# Patient Record
Sex: Female | Born: 1985 | Race: White | Hispanic: No | Marital: Married | Smoking: Never smoker
Health system: Southern US, Community
[De-identification: ages and names within clinical notes are randomized; demographics above are authoritative.]

## PROBLEM LIST (undated history)

## (undated) DIAGNOSIS — K589 Irritable bowel syndrome without diarrhea: Secondary | ICD-10-CM

## (undated) DIAGNOSIS — J45909 Unspecified asthma, uncomplicated: Secondary | ICD-10-CM

## (undated) DIAGNOSIS — O24419 Gestational diabetes mellitus in pregnancy, unspecified control: Secondary | ICD-10-CM

## (undated) DIAGNOSIS — F419 Anxiety disorder, unspecified: Secondary | ICD-10-CM

## (undated) DIAGNOSIS — R87629 Unspecified abnormal cytological findings in specimens from vagina: Secondary | ICD-10-CM

## (undated) HISTORY — DX: Unspecified asthma, uncomplicated: J45.909

## (undated) HISTORY — DX: Unspecified abnormal cytological findings in specimens from vagina: R87.629

## (undated) HISTORY — DX: Anxiety disorder, unspecified: F41.9

## (undated) HISTORY — DX: Irritable bowel syndrome, unspecified: K58.9

## (undated) HISTORY — PX: LEEP: SHX91

---

## 2014-08-03 ENCOUNTER — Other Ambulatory Visit (HOSPITAL_COMMUNITY)
Admission: RE | Admit: 2014-08-03 | Discharge: 2014-08-03 | Disposition: A | Discharge status: Home / Self Care | Payer: Managed Care, Other (non HMO) | Source: Ambulatory Visit | Attending: Obstetrics & Gynecology | Admitting: Obstetrics & Gynecology

## 2014-08-03 ENCOUNTER — Encounter: Payer: Self-pay | Admitting: Obstetrics & Gynecology

## 2014-08-03 ENCOUNTER — Ambulatory Visit (INDEPENDENT_AMBULATORY_CARE_PROVIDER_SITE_OTHER): Payer: Managed Care, Other (non HMO) | Admitting: Obstetrics & Gynecology

## 2014-08-03 VITALS — BP 110/80 | HR 68 | Ht 61.0 in | Wt 112.0 lb

## 2014-08-03 DIAGNOSIS — N939 Abnormal uterine and vaginal bleeding, unspecified: Secondary | ICD-10-CM | POA: Diagnosis not present

## 2014-08-03 DIAGNOSIS — Z113 Encounter for screening for infections with a predominantly sexual mode of transmission: Secondary | ICD-10-CM | POA: Insufficient documentation

## 2014-08-03 DIAGNOSIS — N923 Ovulation bleeding: Secondary | ICD-10-CM

## 2014-08-03 DIAGNOSIS — Z01419 Encounter for gynecological examination (general) (routine) without abnormal findings: Secondary | ICD-10-CM | POA: Insufficient documentation

## 2014-08-03 DIAGNOSIS — Z87410 Personal history of cervical dysplasia: Secondary | ICD-10-CM | POA: Diagnosis not present

## 2014-08-03 NOTE — Addendum Note (Signed)
Addended by: Doyne Keel on: 08/03/2014 03:45 PM   Modules accepted: Orders

## 2014-08-03 NOTE — Progress Notes (Signed)
Patient ID: Betty Acosta, female   DOB: Feb 16, 1986, 29 y.o.   MRN: [id number]   Chief Complaint  Patient presents with  . gyn visit    vaginal spotting befoe period.     HPI:    29 y.o. No obstetric history on file. Patient's last menstrual period was 07/20/2014.  Pt had LEEP 5 years ago, no Pap in 2 years or so Spots between cycles ovulation Has sex 4-5 times per month Occasional positional dyspareunia Has IBS     No current outpatient prescriptions on file.  Problem Pertinent ROS:       No burning with urination, frequency or urgency No nausea, vomiting or diarrhea Nor fever chills or other constitutional symptoms   Extended ROS:         PMFSH:             Past Medical History  Diagnosis Date  . Asthma     Past Surgical History  Procedure Laterality Date  . Leep      OB History    No data available      Not on File  History   Social History  . Marital Status: Married    Spouse Name: N/A  . Number of Children: N/A  . Years of Education: N/A   Social History Main Topics  . Smoking status: Never Smoker   . Smokeless tobacco: Not on file  . Alcohol Use: Not on file  . Drug Use: Not on file  . Sexual Activity: Yes   Other Topics Concern  . Not on file   Social History Narrative  . No narrative on file    Family History  Problem Relation Age of Onset  . Diabetes Maternal Aunt   . Hypertension Maternal Aunt      Examination:  Vitals:  Blood pressure 110/80, pulse 68, height 5\' 1"  (1.549 m), weight 112 lb (50.803 kg), last menstrual period 07/20/2014.    Physical Examination:     General Appearance:  awake, alert, oriented, in no acute distress Bilateral small inguinal lymph nodes  Vulva:  normal appearing vulva with no masses, tenderness or lesions Vagina:  normal mucosa, scant blood Cervix:  no bleeding following Pap, no cervical motion tenderness, no lesions and nulliparous appearance Uterus:  normal size, contour, position,  consistency, mobility, non-tender Adnexa: ovaries:present,  normal adnexa in size, nontender and no masses     DATA orders and reviews: Labs were ordered today:  GC Chlamydia Pap Imaging studies were not ordered today:    Lab tests were not reviewed today:    Imaging studies were not reviewed today:    I did not independently review/view images, tracing or specimen(not simply the report) myself.  Prescription Drug Management:  New Prescriptions:  Renewed Prescriptions:   Current prescription changes:     Impression/Plan(Problem Based): 1.  History of cervical dysplasia      (follow up of a pre-existing problem) : Additional workup is needed:  Pap done  {2.  Intermenstrual spotting      (new problem:show no change) : Additional workup is needed:  GC Chlamydia done     Follow Up:   6  months

## 2014-08-07 LAB — CYTOLOGY - PAP

## 2015-01-08 ENCOUNTER — Ambulatory Visit: Payer: Managed Care, Other (non HMO) | Admitting: Obstetrics & Gynecology

## 2015-01-09 ENCOUNTER — Encounter: Payer: Self-pay | Admitting: Obstetrics & Gynecology

## 2015-01-09 ENCOUNTER — Ambulatory Visit (INDEPENDENT_AMBULATORY_CARE_PROVIDER_SITE_OTHER): Payer: Managed Care, Other (non HMO) | Admitting: Obstetrics & Gynecology

## 2015-01-09 VITALS — BP 128/80 | HR 58 | Ht 61.25 in | Wt 111.0 lb

## 2015-01-09 DIAGNOSIS — Z3169 Encounter for other general counseling and advice on procreation: Secondary | ICD-10-CM | POA: Diagnosis not present

## 2015-01-09 NOTE — Progress Notes (Signed)
Patient ID: Betty Acosta, female   DOB: 02/12/1986, 29 y.o.   MRN: [id number] Chief Complaint  Patient presents with  . Follow-up    trying to get pregnant    Blood pressure 128/80, pulse 58, height 5' 1.25" (1.556 m), weight 111 lb (50.349 kg), last menstrual period 01/04/2015.  29 y.o.. Patient's last menstrual period was 01/04/2015. The current method of family planning is none.  Subjective This is my second visit with Betty Acosta regarding trying to get pregnant She has relatively regular periods At my request she's been doing ovulation predictor kits which have been positive She is been timing her intercourse appropriately She's never had a pregnancy Her husband's never fathered a pregnancy We discussed at length the issues of female and female infertility issues My biggest concern is this could represent a tubal blockage She denies ever having had a pelvic infection specifically chlamydia or gonorrhea    Objective   Pertinent ROS   Labs or studies     Impression Diagnoses this Encounter::   ICD-9-CM ICD-10-CM   1. Infertility counseling V26.49 Z31.69     Established relevant diagnosis(es): Nulliparous  Plan/Recommendations: No orders of the defined types were placed in this encounter.    Labs or Scans Ordered: No orders of the defined types were placed in this encounter.    Betty Acosta is going to call with her next period and we'll then do a hysterosalpingogram with her next pregnancy day 7-11  Follow up Return if symptoms worsen or fail to improve.      Face to face time:  15 minutes  Greater than 50% of the visit time was spent in counseling and coordination of care with the patient.  The summary and outline of the counseling and care coordination is summarized in the note above.   All questions were answered.

## 2015-01-12 ENCOUNTER — Ambulatory Visit: Payer: Managed Care, Other (non HMO) | Admitting: Obstetrics & Gynecology

## 2015-01-31 ENCOUNTER — Other Ambulatory Visit: Payer: Self-pay | Admitting: Obstetrics & Gynecology

## 2015-01-31 ENCOUNTER — Telehealth: Payer: Self-pay | Admitting: Physician Assistant

## 2015-01-31 DIAGNOSIS — N971 Female infertility of tubal origin: Secondary | ICD-10-CM

## 2015-01-31 NOTE — Telephone Encounter (Signed)
I have placed an order in [facility] for an HSG to be done next Wednesday am, 02/07/2015  Please have pt call radiology [phone] to confirm the scheduling  thanks

## 2015-02-01 NOTE — Telephone Encounter (Signed)
Pt informed HSG at [facility] on 02/07/2015 at 0815.

## 2015-02-07 ENCOUNTER — Ambulatory Visit (HOSPITAL_COMMUNITY)
Admission: RE | Admit: 2015-02-07 | Discharge: 2015-02-07 | Disposition: A | Discharge status: Home / Self Care | Payer: Managed Care, Other (non HMO) | Source: Ambulatory Visit | Attending: Obstetrics & Gynecology | Admitting: Obstetrics & Gynecology

## 2015-02-07 DIAGNOSIS — N971 Female infertility of tubal origin: Secondary | ICD-10-CM | POA: Diagnosis present

## 2015-02-07 MED ORDER — IOHEXOL 350 MG/ML SOLN
50.0000 mL | Freq: Once | INTRAVENOUS | Status: DC | PRN
  Administered 2015-02-07: 5 mL via INTRAVENOUS
  Filled 2015-02-07: qty 50

## 2015-02-07 MED ORDER — POVIDONE-IODINE 10 % EX SOLN
CUTANEOUS | Status: AC
  Filled 2015-02-07: qty 15

## 2015-04-25 ENCOUNTER — Ambulatory Visit (INDEPENDENT_AMBULATORY_CARE_PROVIDER_SITE_OTHER): Payer: Managed Care, Other (non HMO) | Admitting: Obstetrics & Gynecology

## 2015-04-25 ENCOUNTER — Encounter: Payer: Self-pay | Admitting: Obstetrics & Gynecology

## 2015-04-25 VITALS — BP 100/60 | HR 62 | Wt 113.0 lb

## 2015-04-25 DIAGNOSIS — Z3169 Encounter for other general counseling and advice on procreation: Secondary | ICD-10-CM | POA: Diagnosis not present

## 2015-04-25 NOTE — Progress Notes (Signed)
Patient ID: Betty Acosta, female   DOB: [birth year], 30 y.o.   MRN: [id number] Patient ID: Betty Acosta, female   DOB: [birth year], 30 y.o.   MRN: [id number]  Normal HSG No other new information  Recommend proceeding with semen analysis If abnormal will have work up and evlaution If normal try 3-6 months of clomid  And if still not successful will have seen by [facility]     Face to face time:  10 minutes  Greater than 50% of the visit time was spent in counseling and coordination of care with the patient.  The summary and outline of the counseling and care coordination is summarized in the note above.   All questions were answered.  Chief Complaint  Patient presents with  . Follow-up    Blood pressure 100/60, pulse 62, weight 113 lb (51.256 kg), last menstrual period 04/24/2015.  29 y.o.. Patient's last menstrual period was 04/24/2015. The current method of family planning is none.  Subjective This is my second visit with Betty Acosta regarding trying to get pregnant She has relatively regular periods At my request she's been doing ovulation predictor kits which have been positive She is been timing her intercourse appropriately She's never had a pregnancy Her husband's never fathered a pregnancy We discussed at length the issues of female and female infertility issues My biggest concern is this could represent a tubal blockage She denies ever having had a pelvic infection specifically chlamydia or gonorrhea    Objective   Pertinent ROS   Labs or studies     Impression Diagnoses this Encounter::   ICD-9-CM ICD-10-CM   1. Infertility counseling V26.49 Z31.69     Established relevant diagnosis(es): Nulliparous  Plan/Recommendations: No orders of the defined types were placed in this encounter.    Labs or Scans Ordered: No orders of the defined types were placed in this encounter.    Betty Acosta is going to call with her next period and we'll then do a hysterosalpingogram with  her next pregnancy day 7-11  Follow up Return if symptoms worsen or fail to improve.      Face to face time:  15 minutes  Greater than 50% of the visit time was spent in counseling and coordination of care with the patient.  The summary and outline of the counseling and care coordination is summarized in the note above.   All questions were answered.

## 2015-10-09 ENCOUNTER — Telehealth: Payer: Self-pay | Admitting: Obstetrics & Gynecology

## 2015-10-09 NOTE — Telephone Encounter (Addendum)
Pt informed as soon as our office receive fax for seaman analysis will let Dr. Elonda Husky review and contact pt with plan. Pt verbalized understanding.   Received fax of [facility] analysis and gave to Dr.Eure for is review.

## 2015-10-22 ENCOUNTER — Telehealth: Payer: Self-pay | Admitting: *Deleted

## 2015-10-22 NOTE — Telephone Encounter (Signed)
Pt informed Dr.Eure does have the [facility] analysis results, will route message to him for plan.

## 2015-10-23 NOTE — Telephone Encounter (Signed)
I called pt and left a message to contact Roscoe at [email or web address] to investigate next level fertility evaluation and management Informed the semen analysis was normal Pt told to call back here if she has any questions

## 2015-10-24 NOTE — Telephone Encounter (Signed)
Pt states she is interested in starting Clomid before being seen by a reproductive endocrinologist.   Per Dr.Eure, pt is have regular periods therefore ovulation is not an issue, so at this point he usually refers to Reproductive Endocrinologist. Dr.Eure states is willing to do a few months of Clomid but would like for the pt and her husband to think about it a few days and call our office back with decision.   Pt verbalized understanding.

## 2015-10-29 ENCOUNTER — Telehealth: Payer: Self-pay | Admitting: Obstetrics & Gynecology

## 2015-10-29 NOTE — Telephone Encounter (Signed)
Duplicate message. 

## 2015-10-30 ENCOUNTER — Other Ambulatory Visit: Payer: Self-pay | Admitting: Obstetrics & Gynecology

## 2015-10-30 MED ORDER — CLOMIPHENE CITRATE 50 MG PO TABS: ORAL_TABLET | ORAL | 11 refills | Status: DC

## 2016-02-01 ENCOUNTER — Ambulatory Visit (INDEPENDENT_AMBULATORY_CARE_PROVIDER_SITE_OTHER): Payer: Managed Care, Other (non HMO) | Admitting: Obstetrics & Gynecology

## 2016-02-01 ENCOUNTER — Encounter: Payer: Self-pay | Admitting: Obstetrics & Gynecology

## 2016-02-01 VITALS — BP 120/70 | HR 72 | Wt 118.0 lb

## 2016-02-01 DIAGNOSIS — N6310 Unspecified lump in the right breast, unspecified quadrant: Secondary | ICD-10-CM

## 2016-02-01 DIAGNOSIS — N6315 Unspecified lump in the right breast, overlapping quadrants: Secondary | ICD-10-CM

## 2016-02-01 NOTE — Progress Notes (Signed)
Chief Complaint  Patient presents with  . lump RT breast    Blood pressure 120/70, pulse 72, weight 118 lb (53.5 kg), last menstrual period 01/29/2016.  30 y.o. No obstetric history on file. Patient's last menstrual period was 01/29/2016. The current method of family planning is none. Trying to get pregnant  Outpatient Encounter Prescriptions as of 02/01/2016  Medication Sig  . clomiPHENE (CLOMID) 50 MG tablet Take 1 tablet daily for 5 days.  Start the tablets on the first day of your menstrual cycle   No facility-administered encounter medications on file as of 02/01/2016.     Subjective Pt states she has felt a right breast mass for about 6 months unchanged No erythema discharge from nipple or tenderness except when she presses  Objective Breast exam reveals no masses or tenderness no nipple changes The spot that Elaisha is concerned about is actually her rib, when I move the breast about on her chest wall the area remains the same  Pertinent ROS No burning with urination, frequency or urgency No nausea, vomiting or diarrhea Nor fever chills or other constitutional symptoms   Labs or studies     Impression Diagnoses this Encounter::   ICD-9-CM ICD-10-CM   1. Breast lump on right side at 6 o'clock position 611.72 N63.10    actually not a breast mass but her rib, pt examined as well with me and also agrees that iti is her rib she is felling    Established relevant diagnosis(es): Primary infertility  Plan/Recommendations: No orders of the defined types were placed in this encounter.   Labs or Scans Ordered: No orders of the defined types were placed in this encounter.   Management:: No imaging studies or follow up needed Has made an appointment to see [facility]  Follow up Return if symptoms worsen or fail to improve.        Face to face time:  15 minutes  Greater than 50% of the visit time was spent in counseling and coordination of care with the  patient.  The summary and outline of the counseling and care coordination is summarized in the note above.   All questions were answered.  Past Medical History:  Diagnosis Date  . Asthma   . IBS (irritable bowel syndrome)     Past Surgical History:  Procedure Laterality Date  . LEEP      OB History    No data available      No Known Allergies  Social History   Social History  . Marital status: Married    Spouse name: N/A  . Number of children: N/A  . Years of education: N/A   Social History Main Topics  . Smoking status: Never Smoker  . Smokeless tobacco: Never Used  . Alcohol use No  . Drug use: No  . Sexual activity: Yes    Birth control/ protection: None   Other Topics Concern  . None   Social History Narrative  . None    Family History  Problem Relation Age of Onset  . Diabetes Maternal Aunt   . Hypertension Maternal Aunt   . Diabetes Maternal Grandmother   . Congestive Heart Failure Mother   . Hypertension Mother   . Fibromyalgia Mother   . Other Mother     heart blockages  . Hypertension Sister   . Other Sister     broderline diabetes  . ADD / ADHD Brother   . Autism Brother   .  Cancer Paternal Grandmother   . ADD / ADHD Brother

## 2016-12-08 ENCOUNTER — Telehealth: Payer: Self-pay | Admitting: Obstetrics & Gynecology

## 2016-12-08 NOTE — Telephone Encounter (Signed)
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 Please contact pt

## 2016-12-08 NOTE — Telephone Encounter (Signed)
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 Advised patient at this time lifting is fine but could get a note if needed at her visit next week. Verbalized understanding.

## 2016-12-11 ENCOUNTER — Telehealth: Payer: Self-pay | Admitting: Women's Health

## 2016-12-11 ENCOUNTER — Ambulatory Visit (INDEPENDENT_AMBULATORY_CARE_PROVIDER_SITE_OTHER): Payer: 59 | Admitting: Adult Health

## 2016-12-11 ENCOUNTER — Encounter: Payer: Self-pay | Admitting: Adult Health

## 2016-12-11 VITALS — BP 90/50 | HR 80 | Ht 62.0 in | Wt 111.0 lb

## 2016-12-11 DIAGNOSIS — Z3201 Encounter for pregnancy test, result positive: Secondary | ICD-10-CM | POA: Diagnosis not present

## 2016-12-11 DIAGNOSIS — N926 Irregular menstruation, unspecified: Secondary | ICD-10-CM | POA: Diagnosis not present

## 2016-12-11 DIAGNOSIS — O26851 Spotting complicating pregnancy, first trimester: Secondary | ICD-10-CM

## 2016-12-11 DIAGNOSIS — O3680X0 Pregnancy with inconclusive fetal viability, not applicable or unspecified: Secondary | ICD-10-CM

## 2016-12-11 LAB — POCT WET PREP (WET MOUNT): WBC, Wet Prep HPF POC: POSITIVE

## 2016-12-11 LAB — POCT URINE PREGNANCY: Preg Test, Ur: POSITIVE — AB

## 2016-12-11 NOTE — Telephone Encounter (Signed)
Pt called stating that she had been spotting light pink and had already spoken to someone about that; but last night she started spotting bright red. She states that she is no longer spotting this morning but is concerned because her appt is not until Tuesday. Pt was offered an appt today at 1:30

## 2016-12-11 NOTE — Addendum Note (Signed)
Addended by: Derrek Monaco A on: 12/11/2016 02:26 PM   Modules accepted: Orders

## 2016-12-11 NOTE — Progress Notes (Signed)
Subjective:     Patient ID: Betty Acosta, female   DOB: 12-08-1985, 31 y.o.   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 She had taken clomid in the past but not lately, so when missed a period and had +HPT was excited.   Review of Systems  Had missed a period and had +HPT Spotting since 12/02/16 Reviewed past medical,surgical, social and family history. Reviewed medications and allergies.     Objective:   Physical Exam BP (!) 90/50 (BP Location: Left Arm, Patient Position: Sitting, Cuff Size: Small)   Pulse 80   Ht 5\' 2"  (1.575 m)   Wt 111 lb (50.3 kg)   LMP 11/06/2016   BMI 20.30 kg/m UPT +, about 5 weeks by LMP, with EDD 08/13/17.PHQ 2 score 0. Skin warm and dry.Pelvic: external genitalia is normal in appearance no lesions, vagina: pinkish tan discharge without odor,urethra has no lesions or masses noted, cervix:irregular at os, sp LEEP, uterus: about 6 week size, non tender, no masses felt, adnexa: no masses or tenderness noted. Bladder is non tender and no masses felt. Wet prep: + RBCs +WBCs. GC/CHL obtained.     Assessment:     1. Spotting affecting pregnancy in first trimester   2. Pregnancy test positive   3. Encounter to determine fetal viability of pregnancy, single or unspecified fetus       Plan:    GC/CHL sent on urine  Check QHCG and progesterone level Take Gummies Note given for no lifting >25lbs and no ladders over 2 steps Follow up in 2 weeks for dating [place] No sex for now Review handout on first trimester and by [facility]

## 2016-12-11 NOTE — Patient Instructions (Signed)
First Trimester of Pregnancy The first trimester of pregnancy is from week 1 until the end of week 13 (months 1 through 3). A week after a sperm fertilizes an egg, the egg will implant on the wall of the uterus. This embryo will begin to develop into a baby. Genes from you and your partner will form the baby. The female genes will determine whether the baby will be a boy or a girl. At 6-8 weeks, the eyes and face will be formed, and the heartbeat can be seen on ultrasound. At the end of 12 weeks, all the baby's organs will be formed. Now that you are pregnant, you will want to do everything you can to have a healthy baby. Two of the most important things are to get good prenatal care and to follow your health care provider's instructions. Prenatal care is all the medical care you receive before the baby's birth. This care will help prevent, find, and treat any problems during the pregnancy and childbirth. Body changes during your first trimester Your body goes through many changes during pregnancy. The changes vary from woman to woman.  You may gain or lose a couple of pounds at first.  You may feel sick to your stomach (nauseous) and you may throw up (vomit). If the vomiting is uncontrollable, call your health care provider.  You may tire easily.  You may develop headaches that can be relieved by medicines. All medicines should be approved by your health care provider.  You may urinate more often. Painful urination may mean you have a bladder infection.  You may develop heartburn as a result of your pregnancy.  You may develop constipation because certain hormones are causing the muscles that push stool through your intestines to slow down.  You may develop hemorrhoids or swollen veins (varicose veins).  Your breasts may begin to grow larger and become tender. Your nipples may stick out more, and the tissue that surrounds them (areola) may become darker.  Your gums may bleed and may be  sensitive to brushing and flossing.  Dark spots or blotches (chloasma, mask of pregnancy) may develop on your face. This will likely fade after the baby is born.  Your menstrual periods will stop.  You may have a loss of appetite.  You may develop cravings for certain kinds of food.  You may have changes in your emotions from day to day, such as being excited to be pregnant or being concerned that something may go wrong with the pregnancy and baby.  You may have more vivid and strange dreams.  You may have changes in your hair. These can include thickening of your hair, rapid growth, and changes in texture. Some women also have hair loss during or after pregnancy, or hair that feels dry or thin. Your hair will most likely return to normal after your baby is born.  What to expect at prenatal visits During a routine prenatal visit:  You will be weighed to make sure you and the baby are growing normally.  Your blood pressure will be taken.  Your abdomen will be measured to track your baby's growth.  The fetal heartbeat will be listened to between weeks 10 and 14 of your pregnancy.  Test results from any previous visits will be discussed.  Your health care provider may ask you:  How you are feeling.  If you are feeling the baby move.  If you have had any abnormal symptoms, such as leaking fluid, bleeding, severe headaches,   or abdominal cramping.  If you are using any tobacco products, including cigarettes, chewing tobacco, and electronic cigarettes.  If you have any questions.  Other tests that may be performed during your first trimester include:  Blood tests to find your blood type and to check for the presence of any previous infections. The tests will also be used to check for low iron levels (anemia) and protein on red blood cells (Rh antibodies). Depending on your risk factors, or if you previously had diabetes during pregnancy, you may have tests to check for high blood  sugar that affects pregnant women (gestational diabetes).  Urine tests to check for infections, diabetes, or protein in the urine.  An ultrasound to confirm the proper growth and development of the baby.  Fetal screens for spinal cord problems (spina bifida) and Down syndrome.  HIV (human immunodeficiency virus) testing. Routine prenatal testing includes screening for HIV, unless you choose not to have this test.  You may need other tests to make sure you and the baby are doing well.  Follow these instructions at home: Medicines  Follow your health care provider's instructions regarding medicine use. Specific medicines may be either safe or unsafe to take during pregnancy.  Take a prenatal vitamin that contains at least 600 micrograms (mcg) of folic acid.  If you develop constipation, try taking a stool softener if your health care provider approves. Eating and drinking  Eat a balanced diet that includes fresh fruits and vegetables, whole grains, good sources of protein such as meat, eggs, or tofu, and low-fat dairy. Your health care provider will help you determine the amount of weight gain that is right for you.  Avoid raw meat and uncooked cheese. These carry germs that can cause birth defects in the baby.  Eating four or five small meals rather than three large meals a day may help relieve nausea and vomiting. If you start to feel nauseous, eating a few soda crackers can be helpful. Drinking liquids between meals, instead of during meals, also seems to help ease nausea and vomiting.  Limit foods that are high in fat and processed sugars, such as fried and sweet foods.  To prevent constipation: ? Eat foods that are high in fiber, such as fresh fruits and vegetables, whole grains, and beans. ? Drink enough fluid to keep your urine clear or pale yellow. Activity  Exercise only as directed by your health care provider. Most women can continue their usual exercise routine during  pregnancy. Try to exercise for 30 minutes at least 5 days a week. Exercising will help you: ? Control your weight. ? Stay in shape. ? Be prepared for labor and delivery.  Experiencing pain or cramping in the lower abdomen or lower back is a good sign that you should stop exercising. Check with your health care provider before continuing with normal exercises.  Try to avoid standing for long periods of time. Move your legs often if you must stand in one place for a long time.  Avoid heavy lifting.  Wear low-heeled shoes and practice good posture.  You may continue to have sex unless your health care provider tells you not to. Relieving pain and discomfort  Wear a good support bra to relieve breast tenderness.  Take warm sitz baths to soothe any pain or discomfort caused by hemorrhoids. Use hemorrhoid cream if your health care provider approves.  Rest with your legs elevated if you have leg cramps or low back pain.  If you develop   varicose veins in your legs, wear support hose. Elevate your feet for 15 minutes, 3-4 times a day. Limit salt in your diet. Prenatal care  Schedule your prenatal visits by the twelfth week of pregnancy. They are usually scheduled monthly at first, then more often in the last 2 months before delivery.  Write down your questions. Take them to your prenatal visits.  Keep all your prenatal visits as told by your health care provider. This is important. Safety  Wear your seat belt at all times when driving.  Make a list of emergency phone numbers, including numbers for family, friends, the hospital, and police and fire departments. General instructions  Ask your health care provider for a referral to a local prenatal education class. Begin classes no later than the beginning of month 6 of your pregnancy.  Ask for help if you have counseling or nutritional needs during pregnancy. Your health care provider can offer advice or refer you to specialists for help  with various needs.  Do not use hot tubs, steam rooms, or saunas.  Do not douche or use tampons or scented sanitary pads.  Do not cross your legs for long periods of time.  Avoid cat litter boxes and soil used by cats. These carry germs that can cause birth defects in the baby and possibly loss of the fetus by miscarriage or stillbirth.  Avoid all smoking, herbs, alcohol, and medicines not prescribed by your health care provider. Chemicals in these products affect the formation and growth of the baby.  Do not use any products that contain nicotine or tobacco, such as cigarettes and e-cigarettes. If you need help quitting, ask your health care provider. You may receive counseling support and other resources to help you quit.  Schedule a dentist appointment. At home, brush your teeth with a soft toothbrush and be gentle when you floss. Contact a health care provider if:  You have dizziness.  You have mild pelvic cramps, pelvic pressure, or nagging pain in the abdominal area.  You have persistent nausea, vomiting, or diarrhea.  You have a bad smelling vaginal discharge.  You have pain when you urinate.  You notice increased swelling in your face, hands, legs, or ankles.  You are exposed to fifth disease or chickenpox.  You are exposed to German measles (rubella) and have never had it. Get help right away if:  You have a fever.  You are leaking fluid from your vagina.  You have spotting or bleeding from your vagina.  You have severe abdominal cramping or pain.  You have rapid weight gain or loss.  You vomit blood or material that looks like coffee grounds.  You develop a severe headache.  You have shortness of breath.  You have any kind of trauma, such as from a fall or a car accident. Summary  The first trimester of pregnancy is from week 1 until the end of week 13 (months 1 through 3).  Your body goes through many changes during pregnancy. The changes vary from  woman to woman.  You will have routine prenatal visits. During those visits, your health care provider will examine you, discuss any test results you may have, and talk with you about how you are feeling. This information is not intended to replace advice given to you by your health care provider. Make sure you discuss any questions you have with your health care provider. Document Released: 03/04/2001 Document Revised: 02/20/2016 Document Reviewed: 02/20/2016 Elsevier Interactive Patient Education  2017 [employer]  

## 2016-12-12 LAB — PROGESTERONE: Progesterone: 26.1 ng/mL

## 2016-12-12 LAB — BETA HCG QUANT (REF LAB): HCG QUANT: 12240 m[IU]/mL

## 2016-12-13 LAB — GC/CHLAMYDIA PROBE AMP
CHLAMYDIA, DNA PROBE: NEGATIVE
Neisseria gonorrhoeae by PCR: NEGATIVE

## 2016-12-15 ENCOUNTER — Ambulatory Visit: Payer: Managed Care, Other (non HMO) | Admitting: Adult Health

## 2016-12-15 ENCOUNTER — Telehealth: Payer: Self-pay | Admitting: Adult Health

## 2016-12-15 NOTE — Telephone Encounter (Signed)
Pt aware of labs, keep [place] appt next week

## 2016-12-16 ENCOUNTER — Ambulatory Visit: Payer: Managed Care, Other (non HMO) | Admitting: Obstetrics & Gynecology

## 2016-12-18 ENCOUNTER — Telehealth: Payer: Self-pay | Admitting: Obstetrics & Gynecology

## 2016-12-18 NOTE — Telephone Encounter (Signed)
Left message letting pt know restrictions are for entire pregnancy; lifting no more than 25 lbs, and climbing no more than 2 steps on ladder. [place]

## 2016-12-25 ENCOUNTER — Ambulatory Visit (INDEPENDENT_AMBULATORY_CARE_PROVIDER_SITE_OTHER): Payer: 59

## 2016-12-25 DIAGNOSIS — Z3A01 Less than 8 weeks gestation of pregnancy: Secondary | ICD-10-CM

## 2016-12-25 DIAGNOSIS — O3680X0 Pregnancy with inconclusive fetal viability, not applicable or unspecified: Secondary | ICD-10-CM

## 2016-12-25 NOTE — Progress Notes (Signed)
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

## 2017-01-02 ENCOUNTER — Telehealth: Payer: Self-pay | Admitting: Obstetrics & Gynecology

## 2017-01-02 NOTE — Telephone Encounter (Signed)
Pt called stating that she had been spotting and Anderson Malta had told her not to have intercourse until 7 days after bleeding had stopped. Pt states that the bleeding had stopped for 2 days and she and her partner "did something" but not intercourse. She states that last night she had some mild cramping, and now she has a feeling of tightness/stimulation. She states that she has only spotted like before. I advised pt that she should not have any form of sexual relations until the 7 days after bleeding stops as Anderson Malta instructed her before. Pt verbalized understanding.

## 2017-01-02 NOTE — Telephone Encounter (Signed)
Patient called stating that she would like to speak with a doctor about something very personal. Please contact pt

## 2017-01-07 ENCOUNTER — Encounter: Payer: Self-pay | Admitting: Advanced Practice Midwife

## 2017-01-07 ENCOUNTER — Ambulatory Visit (INDEPENDENT_AMBULATORY_CARE_PROVIDER_SITE_OTHER): Payer: 59 | Admitting: Advanced Practice Midwife

## 2017-01-07 ENCOUNTER — Ambulatory Visit: Payer: 59 | Admitting: *Deleted

## 2017-01-07 VITALS — BP 112/70 | HR 76 | Wt 113.0 lb

## 2017-01-07 DIAGNOSIS — Z3682 Encounter for antenatal screening for nuchal translucency: Secondary | ICD-10-CM

## 2017-01-07 DIAGNOSIS — Z331 Pregnant state, incidental: Secondary | ICD-10-CM

## 2017-01-07 DIAGNOSIS — Z3401 Encounter for supervision of normal first pregnancy, first trimester: Secondary | ICD-10-CM

## 2017-01-07 DIAGNOSIS — Z3400 Encounter for supervision of normal first pregnancy, unspecified trimester: Secondary | ICD-10-CM | POA: Insufficient documentation

## 2017-01-07 DIAGNOSIS — Z3A08 8 weeks gestation of pregnancy: Secondary | ICD-10-CM

## 2017-01-07 DIAGNOSIS — Z1389 Encounter for screening for other disorder: Secondary | ICD-10-CM

## 2017-01-07 LAB — POCT URINALYSIS DIPSTICK
Blood, UA: NEGATIVE
Glucose, UA: NEGATIVE
KETONES UA: NEGATIVE
Leukocytes, UA: NEGATIVE
Nitrite, UA: NEGATIVE
PROTEIN UA: NEGATIVE

## 2017-01-07 MED ORDER — PROMETHAZINE HCL 25 MG PO TABS: 25.0000 mg | ORAL_TABLET | Freq: Four times a day (QID) | ORAL | 1 refills | Status: DC | PRN

## 2017-01-07 MED ORDER — DOXYLAMINE-PYRIDOXINE 10-10 MG PO TBEC: DELAYED_RELEASE_TABLET | ORAL | 3 refills | Status: DC

## 2017-01-07 NOTE — Patient Instructions (Signed)
 First Trimester of Pregnancy The first trimester of pregnancy is from week 1 until the end of week 12 (months 1 through 3). A week after a sperm fertilizes an egg, the egg will implant on the wall of the uterus. This embryo will begin to develop into a baby. Genes from you and your partner are forming the baby. The female genes determine whether the baby is a boy or a girl. At 6-8 weeks, the eyes and face are formed, and the heartbeat can be seen on ultrasound. At the end of 12 weeks, all the baby's organs are formed.  Now that you are pregnant, you will want to do everything you can to have a healthy baby. Two of the most important things are to get good prenatal care and to follow your health care provider's instructions. Prenatal care is all the medical care you receive before the baby's birth. This care will help prevent, find, and treat any problems during the pregnancy and childbirth. BODY CHANGES Your body goes through many changes during pregnancy. The changes vary from woman to woman.   You may gain or lose a couple of pounds at first.  You may feel sick to your stomach (nauseous) and throw up (vomit). If the vomiting is uncontrollable, call your health care provider.  You may tire easily.  You may develop headaches that can be relieved by medicines approved by your health care provider.  You may urinate more often. Painful urination may mean you have a bladder infection.  You may develop heartburn as a result of your pregnancy.  You may develop constipation because certain hormones are causing the muscles that push waste through your intestines to slow down.  You may develop hemorrhoids or swollen, bulging veins (varicose veins).  Your breasts may begin to grow larger and become tender. Your nipples may stick out more, and the tissue that surrounds them (areola) may become darker.  Your gums may bleed and may be sensitive to brushing and flossing.  Dark spots or blotches  (chloasma, mask of pregnancy) may develop on your face. This will likely fade after the baby is born.  Your menstrual periods will stop.  You may have a loss of appetite.  You may develop cravings for certain kinds of food.  You may have changes in your emotions from day to day, such as being excited to be pregnant or being concerned that something may go wrong with the pregnancy and baby.  You may have more vivid and strange dreams.  You may have changes in your hair. These can include thickening of your hair, rapid growth, and changes in texture. Some women also have hair loss during or after pregnancy, or hair that feels dry or thin. Your hair will most likely return to normal after your baby is born. WHAT TO EXPECT AT YOUR PRENATAL VISITS During a routine prenatal visit:  You will be weighed to make sure you and the baby are growing normally.  Your blood pressure will be taken.  Your abdomen will be measured to track your baby's growth.  The fetal heartbeat will be listened to starting around week 10 or 12 of your pregnancy.  Test results from any previous visits will be discussed. Your health care provider may ask you:  How you are feeling.  If you are feeling the baby move.  If you have had any abnormal symptoms, such as leaking fluid, bleeding, severe headaches, or abdominal cramping.  If you have any questions. Other   tests that may be performed during your first trimester include:  Blood tests to find your blood type and to check for the presence of any previous infections. They will also be used to check for low iron levels (anemia) and Rh antibodies. Later in the pregnancy, blood tests for diabetes will be done along with other tests if problems develop.  Urine tests to check for infections, diabetes, or protein in the urine.  An ultrasound to confirm the proper growth and development of the baby.  An amniocentesis to check for possible genetic problems.  Fetal  screens for spina bifida and Down syndrome.  You may need other tests to make sure you and the baby are doing well. HOME CARE INSTRUCTIONS  Medicines  Follow your health care provider's instructions regarding medicine use. Specific medicines may be either safe or unsafe to take during pregnancy.  Take your prenatal vitamins as directed.  If you develop constipation, try taking a stool softener if your health care provider approves. Diet  Eat regular, well-balanced meals. Choose a variety of foods, such as meat or vegetable-based protein, fish, milk and low-fat dairy products, vegetables, fruits, and whole grain breads and cereals. Your health care provider will help you determine the amount of weight gain that is right for you.  Avoid raw meat and uncooked cheese. These carry germs that can cause birth defects in the baby.  Eating four or five small meals rather than three large meals a day may help relieve nausea and vomiting. If you start to feel nauseous, eating a few soda crackers can be helpful. Drinking liquids between meals instead of during meals also seems to help nausea and vomiting.  If you develop constipation, eat more high-fiber foods, such as fresh vegetables or fruit and whole grains. Drink enough fluids to keep your urine clear or pale yellow. Activity and Exercise  Exercise only as directed by your health care provider. Exercising will help you:  Control your weight.  Stay in shape.  Be prepared for labor and delivery.  Experiencing pain or cramping in the lower abdomen or low back is a good sign that you should stop exercising. Check with your health care provider before continuing normal exercises.  Try to avoid standing for long periods of time. Move your legs often if you must stand in one place for a long time.  Avoid heavy lifting.  Wear low-heeled shoes, and practice good posture.  You may continue to have sex unless your health care provider directs you  otherwise. Relief of Pain or Discomfort  Wear a good support bra for breast tenderness.   Take warm sitz baths to soothe any pain or discomfort caused by hemorrhoids. Use hemorrhoid cream if your health care provider approves.   Rest with your legs elevated if you have leg cramps or low back pain.  If you develop varicose veins in your legs, wear support hose. Elevate your feet for 15 minutes, 3-4 times a day. Limit salt in your diet. Prenatal Care  Schedule your prenatal visits by the twelfth week of pregnancy. They are usually scheduled monthly at first, then more often in the last 2 months before delivery.  Write down your questions. Take them to your prenatal visits.  Keep all your prenatal visits as directed by your health care provider. Safety  Wear your seat belt at all times when driving.  Make a list of emergency phone numbers, including numbers for family, friends, the hospital, and police and fire departments. General   Tips  Ask your health care provider for a referral to a local prenatal education class. Begin classes no later than at the beginning of month 6 of your pregnancy.  Ask for help if you have counseling or nutritional needs during pregnancy. Your health care provider can offer advice or refer you to specialists for help with various needs.  Do not use hot tubs, steam rooms, or saunas.  Do not douche or use tampons or scented sanitary pads.  Do not cross your legs for long periods of time.  Avoid cat litter boxes and soil used by cats. These carry germs that can cause birth defects in the baby and possibly loss of the fetus by miscarriage or stillbirth.  Avoid all smoking, herbs, alcohol, and medicines not prescribed by your health care provider. Chemicals in these affect the formation and growth of the baby.  Schedule a dentist appointment. At home, brush your teeth with a soft toothbrush and be gentle when you floss. SEEK MEDICAL CARE IF:   You have  dizziness.  You have mild pelvic cramps, pelvic pressure, or nagging pain in the abdominal area.  You have persistent nausea, vomiting, or diarrhea.  You have a bad smelling vaginal discharge.  You have pain with urination.  You notice increased swelling in your face, hands, legs, or ankles. SEEK IMMEDIATE MEDICAL CARE IF:   You have a fever.  You are leaking fluid from your vagina.  You have spotting or bleeding from your vagina.  You have severe abdominal cramping or pain.  You have rapid weight gain or loss.  You vomit blood or material that looks like coffee grounds.  You are exposed to German measles and have never had them.  You are exposed to fifth disease or chickenpox.  You develop a severe headache.  You have shortness of breath.  You have any kind of trauma, such as from a fall or a car accident. Document Released: 03/04/2001 Document Revised: 07/25/2013 Document Reviewed: 01/18/2013 [employer] Patient Information 2015 [employer]. This information is not intended to replace advice given to you by your health care provider. Make sure you discuss any questions you have with your health care provider.   Nausea & Vomiting  Have saltine crackers or pretzels by your bed and eat a few bites before you raise your head out of bed in the morning  Eat small frequent meals throughout the day instead of large meals  Drink plenty of fluids throughout the day to stay hydrated, just don't drink a lot of fluids with your meals.  This can make your stomach fill up faster making you feel sick  Do not brush your teeth right after you eat  Products with real ginger are good for nausea, like ginger ale and ginger hard candy Make sure it says made with real ginger!  Sucking on sour candy like lemon heads is also good for nausea  If your prenatal vitamins make you nauseated, take them at night so you will sleep through the nausea  Sea Bands  If you feel like you need  medicine for the nausea & vomiting please let us know  If you are unable to keep any fluids or food down please let us know   Constipation  Drink plenty of fluid, preferably water, throughout the day  Eat foods high in fiber such as fruits, vegetables, and grains  Exercise, such as walking, is a good way to keep your bowels regular  Drink warm fluids, especially warm   prune juice, or decaf coffee  Eat a 1/2 cup of real oatmeal (not instant), 1/2 cup applesauce, and 1/2-1 cup warm prune juice every day  If needed, you may take Colace (docusate sodium) stool softener once or twice a day to help keep the stool soft. If you are pregnant, wait until you are out of your first trimester (12-14 weeks of pregnancy)  If you still are having problems with constipation, you may take Miralax once daily as needed to help keep your bowels regular.  If you are pregnant, wait until you are out of your first trimester (12-14 weeks of pregnancy)  Safe Medications in Pregnancy   Acne: Benzoyl Peroxide Salicylic Acid  Backache/Headache: Tylenol: 2 regular strength every 4 hours OR              2 Extra strength every 6 hours  Colds/Coughs/Allergies: Benadryl (alcohol free) 25 mg every 6 hours as needed Breath right strips Claritin Cepacol throat lozenges Chloraseptic throat spray Cold-Eeze- up to three times per day Cough drops, alcohol free Flonase (by prescription only) Guaifenesin Mucinex Robitussin DM (plain only, alcohol free) Saline nasal spray/drops Sudafed (pseudoephedrine) & Actifed ** use only after [redacted] weeks gestation and if you do not have high blood pressure Tylenol Vicks Vaporub Zinc lozenges Zyrtec   Constipation: Colace Ducolax suppositories Fleet enema Glycerin suppositories Metamucil Milk of magnesia Miralax Senokot Smooth move tea  Diarrhea: Kaopectate Imodium A-D  *NO pepto Bismol  Hemorrhoids: Anusol Anusol HC Preparation  H Tucks  Indigestion: Tums Maalox Mylanta Zantac  Pepcid  Insomnia: Benadryl (alcohol free) 25mg every 6 hours as needed Tylenol PM Unisom, no Gelcaps  Leg Cramps: Tums MagGel  Nausea/Vomiting:  Bonine Dramamine Emetrol Ginger extract Sea bands Meclizine  Nausea medication to take during pregnancy:  Unisom (doxylamine succinate 25 mg tablets) Take one tablet daily at bedtime. If symptoms are not adequately controlled, the dose can be increased to a maximum recommended dose of two tablets daily (1/2 tablet in the morning, 1/2 tablet mid-afternoon and one at bedtime). Vitamin B6 100mg tablets. Take one tablet twice a day (up to 200 mg per day).  Skin Rashes: Aveeno products Benadryl cream or 25mg every 6 hours as needed Calamine Lotion 1% cortisone cream  Yeast infection: Gyne-lotrimin 7 Monistat 7   **If taking multiple medications, please check labels to avoid duplicating the same active ingredients **take medication as directed on the label ** Do not exceed 4000 mg of tylenol in 24 hours **Do not take medications that contain aspirin or ibuprofen      

## 2017-01-07 NOTE — Progress Notes (Signed)
  Subjective:    Betty Acosta is a G1P0 [redacted]w[redacted]d being seen today for her first obstetrical visit.  Her obstetrical history is significant for first baby.  Pregnancy history fully reviewed.  Patient reports nausea.  Vitals:   01/07/17 1342  BP: 112/70  Pulse: 76  Weight: 113 lb (51.3 kg)    HISTORY: OB History  Gravida Para Term Preterm AB Living  1            SAB TAB Ectopic Multiple Live Births               # Outcome Date GA Lbr Len/2nd Weight Sex Delivery Anes PTL Lv  1 Current              Past Medical History:  Diagnosis Date  . Anxiety   . Asthma   . IBS (irritable bowel syndrome)   . Vaginal Pap smear, abnormal    Past Surgical History:  Procedure Laterality Date  . LEEP     Family History  Problem Relation Age of Onset  . Diabetes Maternal Aunt   . Hypertension Maternal Aunt   . Diabetes Maternal Grandmother   . Drug abuse Maternal Grandmother   . Hypertension Maternal Grandmother   . Congestive Heart Failure Mother   . Hypertension Mother   . Fibromyalgia Mother   . Other Mother        heart blockages  . Depression Mother   . Anxiety disorder Mother   . Hypertension Sister   . Other Sister        broderline diabetes  . ADD / ADHD Brother   . Autism Brother   . Heart attack Maternal Grandfather   . Cancer Paternal Grandmother   . ADD / ADHD Brother      Exam                                      System:     Skin: normal coloration and turgor, no rashes    Neurologic: oriented, normal, normal mood   Extremities: normal strength, tone, and muscle mass   HEENT PERRLA   Mouth/Teeth mucous membranes moist, normal dentition   Neck supple and no masses   Cardiovascular: regular rate and rhythm   Respiratory:  appears well, vitals normal, no respiratory distress, acyanotic   Abdomen: soft, non-tender;  FHR: 160 [place]        The nature of [place] with multiple MDs and other Advanced Practice Providers  was explained to patient; also emphasized that residents, students are part of our team.  Assessment:    Pregnancy: G1P0 Patient Active Problem List   Diagnosis Date Noted  . Supervision of normal first pregnancy 01/07/2017        Plan:     Initial labs drawn. Continue prenatal vitamins  Rx Diclegis (coupon given) and phenergan Problem list reviewed and updated  Reviewed n/v relief measures and warning s/s to report  Reviewed recommended weight gain based on pre-gravid BMI  Encouraged well-balanced diet Genetic Screening discussed Integrated Screen: requested.  Ultrasound discussed; fetal survey: requested.  Return for 3-4 weeks for NTIT/LROB.  CRESENZO-DISHMAN,Dail Lerew 01/07/2017

## 2017-01-08 LAB — URINALYSIS, ROUTINE W REFLEX MICROSCOPIC
BILIRUBIN UA: NEGATIVE
Glucose, UA: NEGATIVE
Ketones, UA: NEGATIVE
NITRITE UA: NEGATIVE
PH UA: 7.5 (ref 5.0–7.5)
PROTEIN UA: NEGATIVE
RBC UA: NEGATIVE
Specific Gravity, UA: 1.007 (ref 1.005–1.030)
UUROB: 0.2 mg/dL (ref 0.2–1.0)

## 2017-01-08 LAB — PMP SCREEN PROFILE (10S), URINE
Amphetamine Scrn, Ur: NEGATIVE ng/mL
BARBITURATE SCREEN URINE: NEGATIVE ng/mL
BENZODIAZEPINE SCREEN, URINE: NEGATIVE ng/mL
CANNABINOIDS UR QL SCN: NEGATIVE ng/mL
CREATININE(CRT), U: 38.6 mg/dL (ref 20.0–300.0)
Cocaine (Metab) Scrn, Ur: NEGATIVE ng/mL
Methadone Screen, Urine: NEGATIVE ng/mL
OPIATE SCREEN URINE: NEGATIVE ng/mL
OXYCODONE+OXYMORPHONE UR QL SCN: NEGATIVE ng/mL
Ph of Urine: 6.8 (ref 4.5–8.9)
Phencyclidine Qn, Ur: NEGATIVE ng/mL
Propoxyphene Scrn, Ur: NEGATIVE ng/mL

## 2017-01-08 LAB — HIV ANTIBODY (ROUTINE TESTING W REFLEX): HIV Screen 4th Generation wRfx: NONREACTIVE

## 2017-01-08 LAB — CBC
Hematocrit: 38.5 % (ref 34.0–46.6)
Hemoglobin: 13.1 g/dL (ref 11.1–15.9)
MCH: 30.3 pg (ref 26.6–33.0)
MCHC: 34 g/dL (ref 31.5–35.7)
MCV: 89 fL (ref 79–97)
PLATELETS: 251 10*3/uL (ref 150–379)
RBC: 4.32 x10E6/uL (ref 3.77–5.28)
RDW: 13.5 % (ref 12.3–15.4)
WBC: 7.4 10*3/uL (ref 3.4–10.8)

## 2017-01-08 LAB — ANTIBODY SCREEN: ANTIBODY SCREEN: NEGATIVE

## 2017-01-08 LAB — MICROSCOPIC EXAMINATION
Casts: NONE SEEN /lpf
WBC, UA: NONE SEEN /hpf (ref 0–?)

## 2017-01-08 LAB — GC/CHLAMYDIA PROBE AMP
CHLAMYDIA, DNA PROBE: NEGATIVE
Neisseria gonorrhoeae by PCR: NEGATIVE

## 2017-01-08 LAB — VARICELLA ZOSTER ANTIBODY, IGG: Varicella zoster IgG: 2351 index (ref >165)

## 2017-01-08 LAB — HEPATITIS B SURFACE ANTIGEN: Hepatitis B Surface Ag: NEGATIVE

## 2017-01-08 LAB — ABO/RH: RH TYPE: POSITIVE

## 2017-01-08 LAB — RUBELLA SCREEN: RUBELLA: 5.02 {index} (ref >0.99)

## 2017-01-08 LAB — RPR: RPR: NONREACTIVE

## 2017-01-09 LAB — URINE CULTURE

## 2017-01-28 ENCOUNTER — Other Ambulatory Visit: Payer: 59

## 2017-01-29 ENCOUNTER — Ambulatory Visit (INDEPENDENT_AMBULATORY_CARE_PROVIDER_SITE_OTHER): Payer: 59 | Admitting: Women's Health

## 2017-01-29 ENCOUNTER — Encounter: Payer: Self-pay | Admitting: Women's Health

## 2017-01-29 ENCOUNTER — Ambulatory Visit (INDEPENDENT_AMBULATORY_CARE_PROVIDER_SITE_OTHER): Payer: 59

## 2017-01-29 VITALS — BP 100/62 | HR 60 | Wt 113.0 lb

## 2017-01-29 DIAGNOSIS — Z3A12 12 weeks gestation of pregnancy: Secondary | ICD-10-CM

## 2017-01-29 DIAGNOSIS — Q5122 Other partial doubling of uterus: Secondary | ICD-10-CM

## 2017-01-29 DIAGNOSIS — Z331 Pregnant state, incidental: Secondary | ICD-10-CM

## 2017-01-29 DIAGNOSIS — Z3682 Encounter for antenatal screening for nuchal translucency: Secondary | ICD-10-CM

## 2017-01-29 DIAGNOSIS — Z1389 Encounter for screening for other disorder: Secondary | ICD-10-CM

## 2017-01-29 DIAGNOSIS — O3401 Maternal care for unspecified congenital malformation of uterus, first trimester: Secondary | ICD-10-CM

## 2017-01-29 DIAGNOSIS — Z3401 Encounter for supervision of normal first pregnancy, first trimester: Secondary | ICD-10-CM

## 2017-01-29 DIAGNOSIS — Z3402 Encounter for supervision of normal first pregnancy, second trimester: Secondary | ICD-10-CM

## 2017-01-29 LAB — POCT URINALYSIS DIPSTICK
Blood, UA: NEGATIVE
Glucose, UA: NEGATIVE
KETONES UA: NEGATIVE
LEUKOCYTES UA: NEGATIVE
Nitrite, UA: NEGATIVE
PROTEIN UA: NEGATIVE

## 2017-01-29 NOTE — Progress Notes (Signed)
LOW-RISK PREGNANCY VISIT Patient name: Betty Acosta MRN [id number]  Date of birth: 1985-09-15 Chief Complaint:   Routine Prenatal Visit (u/s)  History of Present Illness:   Betty Acosta is a 31 y.o. G1P0 female at [redacted]w[redacted]d with an Estimated Date of Delivery: 08/13/17 being seen today for ongoing management of a low-risk pregnancy.  Today she reports continued vag bleeding, not necessarily everyday, sometimes light, sometimes darker. No sex since learning of [facility] earlier in pregnancy.  Denies abnormal discharge, itching/odor/irritation. Vag. Bleeding: Scant. denies leaking of fluid. Review of Systems:   Pertinent items are noted in HPI Denies abnormal vaginal discharge w/ itching/odor/irritation, headaches, visual changes, shortness of breath, chest pain, abdominal pain, severe nausea/vomiting, or problems with urination or bowel movements unless otherwise stated above. Pertinent History Reviewed:  Reviewed past medical,surgical, social, obstetrical and family history.  Reviewed problem list, medications and allergies. Physical Assessment:   Vitals:   01/29/17 1559  BP: 100/62  Pulse: 60  Weight: 113 lb (51.3 kg)  Body mass index is 20.67 kg/m.        Physical Examination:   General appearance: Well appearing, and in no distress  Mental status: Alert, oriented to person, place, and time  Skin: Warm & dry  Cardiovascular: Normal heart rate noted  Respiratory: Normal respiratory effort, no distress  Abdomen: Soft, gravid, nontender  Pelvic: Cervical exam deferred         Extremities: Edema: None  Fetal Status: Fetal Heart Rate (bpm): 166 u/s   Movement: Absent     Today's NT U/S: [place] 12 wks,measurements c/w dates, subseptate uterus,normal ovaries bilat,NB present,NT 1.0 mm,crl 56.05 mm,fhr 166 bpm   Results for orders placed or performed in visit on 01/29/17 (from the past 24 hour(s))  POCT urinalysis dipstick   Collection Time: 01/29/17  4:02 PM  Result Value Ref Range   Color, UA      Clarity, UA     Glucose, UA neg    Bilirubin, UA     Ketones, UA neg    Spec Grav, UA  1.010 - 1.025   Blood, UA neg    pH, UA  5.0 - 8.0   Protein, UA neg    Urobilinogen, UA  0.2 or 1.0 E.U./dL   Nitrite, UA neg    Leukocytes, UA Negative Negative    Assessment & Plan:  1) Low-risk pregnancy G1P0 at [redacted]w[redacted]d with an Estimated Date of Delivery: 08/13/17   2) Subseptate uterus  3) [facility] noted @ 7wks, no mention on today's u/s, does still have small amt vb, Rh+. Recommended continued pelvic rest until at least 7d from no vb. Reviewed reasons to seek care.   Labs/procedures today: 1st IT/NT, Declined flu shot. She was advised that the flu shot is recommended during pregnancy to help protect her and her baby. We discussed that it is an inactivated vaccine-so side effects are minimal, it is considered safe to receive during any trimester, and pregnant women are at a higher risk of developing potential complications from the flu, including death. She was given printed information from the CDC regarding the flu shot and the flu.    Plan:  Continue routine obstetrical care   Reviewed: Preterm labor symptoms and general obstetric precautions including but not limited to vaginal bleeding, contractions, leaking of fluid and fetal movement were reviewed in detail with the patient.  All questions were answered  Follow-up: Return in about 4 weeks (around 02/26/2017) for [place], 2nd IT.  Orders Placed This Encounter  Procedures  . Integrated 1  . POCT urinalysis dipstick   Tawnya Crook CNM, [facility] 01/29/2017 4:45 PM

## 2017-01-29 NOTE — Patient Instructions (Addendum)
Betty Acosta, I greatly value your feedback.  If you receive a survey following your visit with [place] today, we appreciate you taking the time to fill it out.  Thanks, Knute Neu, CNM, WHNP-BC   Second Trimester of Pregnancy The second trimester is from week 14 through week 27 (months 4 through 6). The second trimester is often a time when you feel your best. Your body has adjusted to being pregnant, and you begin to feel better physically. Usually, morning sickness has lessened or quit completely, you may have more energy, and you may have an increase in appetite. The second trimester is also a time when the fetus is growing rapidly. At the end of the sixth month, the fetus is about 9 inches long and weighs about 1 pounds. You will likely begin to feel the baby move (quickening) between 16 and 20 weeks of pregnancy. Body changes during your second trimester Your body continues to go through many changes during your second trimester. The changes vary from woman to woman.  Your weight will continue to increase. You will notice your lower abdomen bulging out.  You may begin to get stretch marks on your hips, abdomen, and breasts.  You may develop headaches that can be relieved by medicines. The medicines should be approved by your health care provider.  You may urinate more often because the fetus is pressing on your bladder.  You may develop or continue to have heartburn as a result of your pregnancy.  You may develop constipation because certain hormones are causing the muscles that push waste through your intestines to slow down.  You may develop hemorrhoids or swollen, bulging veins (varicose veins).  You may have back pain. This is caused by: ? Weight gain. ? Pregnancy hormones that are relaxing the joints in your pelvis. ? A shift in weight and the muscles that support your balance.  Your breasts will continue to grow and they will continue to become tender.  Your gums may bleed and may  be sensitive to brushing and flossing.  Dark spots or blotches (chloasma, mask of pregnancy) may develop on your face. This will likely fade after the baby is born.  A dark line from your belly button to the pubic area (linea nigra) may appear. This will likely fade after the baby is born.  You may have changes in your hair. These can include thickening of your hair, rapid growth, and changes in texture. Some women also have hair loss during or after pregnancy, or hair that feels dry or thin. Your hair will most likely return to normal after your baby is born.  What to expect at prenatal visits During a routine prenatal visit:  You will be weighed to make sure you and the fetus are growing normally.  Your blood pressure will be taken.  Your abdomen will be measured to track your baby's growth.  The fetal heartbeat will be listened to.  Any test results from the previous visit will be discussed.  Your health care provider may ask you:  How you are feeling.  If you are feeling the baby move.  If you have had any abnormal symptoms, such as leaking fluid, bleeding, severe headaches, or abdominal cramping.  If you are using any tobacco products, including cigarettes, chewing tobacco, and electronic cigarettes.  If you have any questions.  Other tests that may be performed during your second trimester include:  Blood tests that check for: ? Low iron levels (anemia). ? High blood  sugar that affects pregnant women (gestational diabetes) between 16 and 28 weeks. ? Rh antibodies. This is to check for a protein on red blood cells (Rh factor).  Urine tests to check for infections, diabetes, or protein in the urine.  An ultrasound to confirm the proper growth and development of the baby.  An amniocentesis to check for possible genetic problems.  Fetal screens for spina bifida and Down syndrome.  HIV (human immunodeficiency virus) testing. Routine prenatal testing includes screening  for HIV, unless you choose not to have this test.  Follow these instructions at home: Medicines  Follow your health care provider's instructions regarding medicine use. Specific medicines may be either safe or unsafe to take during pregnancy.  Take a prenatal vitamin that contains at least 600 micrograms (mcg) of folic acid.  If you develop constipation, try taking a stool softener if your health care provider approves. Eating and drinking  Eat a balanced diet that includes fresh fruits and vegetables, whole grains, good sources of protein such as meat, eggs, or tofu, and low-fat dairy. Your health care provider will help you determine the amount of weight gain that is right for you.  Avoid raw meat and uncooked cheese. These carry germs that can cause birth defects in the baby.  If you have low calcium intake from food, talk to your health care provider about whether you should take a daily calcium supplement.  Limit foods that are high in fat and processed sugars, such as fried and sweet foods.  To prevent constipation: ? Drink enough fluid to keep your urine clear or pale yellow. ? Eat foods that are high in fiber, such as fresh fruits and vegetables, whole grains, and beans. Activity  Exercise only as directed by your health care provider. Most women can continue their usual exercise routine during pregnancy. Try to exercise for 30 minutes at least 5 days a week. Stop exercising if you experience uterine contractions.  Avoid heavy lifting, wear low heel shoes, and practice good posture.  A sexual relationship may be continued unless your health care provider directs you otherwise. Relieving pain and discomfort  Wear a good support bra to prevent discomfort from breast tenderness.  Take warm sitz baths to soothe any pain or discomfort caused by hemorrhoids. Use hemorrhoid cream if your health care provider approves.  Rest with your legs elevated if you have leg cramps or low  back pain.  If you develop varicose veins, wear support hose. Elevate your feet for 15 minutes, 3-4 times a day. Limit salt in your diet. Prenatal Care  Write down your questions. Take them to your prenatal visits.  Keep all your prenatal visits as told by your health care provider. This is important. Safety  Wear your seat belt at all times when driving.  Make a list of emergency phone numbers, including numbers for family, friends, the hospital, and police and fire departments. General instructions  Ask your health care provider for a referral to a local prenatal education class. Begin classes no later than the beginning of month 6 of your pregnancy.  Ask for help if you have counseling or nutritional needs during pregnancy. Your health care provider can offer advice or refer you to specialists for help with various needs.  Do not use hot tubs, steam rooms, or saunas.  Do not douche or use tampons or scented sanitary pads.  Do not cross your legs for long periods of time.  Avoid cat litter boxes and soil used  by cats. These carry germs that can cause birth defects in the baby and possibly loss of the fetus by miscarriage or stillbirth.  Avoid all smoking, herbs, alcohol, and unprescribed drugs. Chemicals in these products can affect the formation and growth of the baby.  Do not use any products that contain nicotine or tobacco, such as cigarettes and e-cigarettes. If you need help quitting, ask your health care provider.  Visit your dentist if you have not gone yet during your pregnancy. Use a soft toothbrush to brush your teeth and be gentle when you floss. Contact a health care provider if:  You have dizziness.  You have mild pelvic cramps, pelvic pressure, or nagging pain in the abdominal area.  You have persistent nausea, vomiting, or diarrhea.  You have a bad smelling vaginal discharge.  You have pain when you urinate. Get help right away if:  You have a  fever.  You are leaking fluid from your vagina.  You have spotting or bleeding from your vagina.  You have severe abdominal cramping or pain.  You have rapid weight gain or weight loss.  You have shortness of breath with chest pain.  You notice sudden or extreme swelling of your face, hands, ankles, feet, or legs.  You have not felt your baby move in over an hour.  You have severe headaches that do not go away when you take medicine.  You have vision changes. Summary  The second trimester is from week 14 through week 27 (months 4 through 6). It is also a time when the fetus is growing rapidly.  Your body goes through many changes during pregnancy. The changes vary from woman to woman.  Avoid all smoking, herbs, alcohol, and unprescribed drugs. These chemicals affect the formation and growth your baby.  Do not use any tobacco products, such as cigarettes, chewing tobacco, and e-cigarettes. If you need help quitting, ask your health care provider.  Contact your health care provider if you have any questions. Keep all prenatal visits as told by your health care provider. This is important. This information is not intended to replace advice given to you by your health care provider. Make sure you discuss any questions you have with your health care provider. Document Released: 03/04/2001 Document Revised: 08/16/2015 Document Reviewed: 05/11/2012 Elsevier Interactive Patient Education  2017 [place] FLU! Because you are pregnant, we at [facility], along with the Centers for Disease Control (CDC), recommend that you receive the flu vaccine to protect yourself and your baby from the flu. The flu is more likely to cause severe illness in pregnant women than in women of reproductive age who are not pregnant. Changes in the immune system, heart, and lungs during pregnancy make pregnant women (and women up to two weeks postpartum) more prone to  severe illness from flu, including illness resulting in hospitalization. Flu also may be harmful for a pregnant woman's developing baby. A common flu symptom is fever, which may be associated with neural tube defects and other adverse outcomes for a developing baby. Getting vaccinated can also help protect a baby after birth from flu. (Mom passes antibodies onto the developing baby during her pregnancy.)  A Flu Vaccine is the Best Protection Against Flu Getting a flu vaccine is the first and most important step in protecting against flu. Pregnant women should get a flu shot and not the live attenuated influenza vaccine (LAIV), also known as nasal spray flu vaccine.  Flu vaccines given during pregnancy help protect both the mother and her baby from flu. Vaccination has been shown to reduce the risk of flu-associated acute respiratory infection in pregnant women by up to one-half. A 2018 study showed that getting a flu shot reduced a pregnant woman's risk of being hospitalized with flu by an average of 40 percent. Pregnant women who get a flu vaccine are also helping to protect their babies from flu illness for the first several months after their birth, when they are too young to get vaccinated.   A Long Record of Safety for Flu Shots in Pregnant Women Flu shots have been given to millions of pregnant women over many years with a good safety record. There is a lot of evidence that flu vaccines can be given safely during pregnancy; though these data are limited for the first trimester. The CDC recommends that pregnant women get vaccinated during any trimester of their pregnancy. It is very important for pregnant women to get the flu shot.   Other Preventive Actions In addition to getting a flu shot, pregnant women should take the same everyday preventive actions the CDC recommends of everyone, including covering coughs, washing hands often, and avoiding people who are sick.  Symptoms and Treatment If you  get sick with flu symptoms call your doctor right away. There are antiviral drugs that can treat flu illness and prevent serious flu complications. The CDC recommends prompt treatment for people who have influenza infection or suspected influenza infection and who are at high risk of serious flu complications, such as people with asthma, diabetes (including gestational diabetes), or heart disease. Early treatment of influenza in hospitalized pregnant women has been shown to reduce the length of the hospital stay.  Symptoms Flu symptoms include fever, cough, sore throat, runny or stuffy nose, body aches, headache, chills and fatigue. Some people may also have vomiting and diarrhea. People may be infected with the flu and have respiratory symptoms without a fever.  Early Treatment is Important for Pregnant Women Treatment should begin as soon as possible because antiviral drugs work best when started early (within 48 hours after symptoms start). Antiviral drugs can make your flu illness milder and make you feel better faster. They may also prevent serious health problems that can result from flu illness. Oral oseltamivir (Tamiflu) is the preferred treatment for pregnant women because it has the most studies available to suggest that it is safe and beneficial. Antiviral drugs require a prescription from your provider. Having a fever caused by flu infection or other infections early in pregnancy may be linked to birth defects in a baby. In addition to taking antiviral drugs, pregnant women who get a fever should treat their fever with Tylenol (acetaminophen) and contact their provider immediately.  When to [place] If you are pregnant and have any of these signs, seek care immediately:  Difficulty breathing or shortness of breath  Pain or pressure in the chest or abdomen  Sudden dizziness  Confusion  Severe or persistent vomiting  High fever that is not responding to Tylenol  (or store brand equivalent)  Decreased or no movement of your baby  SolutionApps.it.htm

## 2017-01-29 NOTE — Progress Notes (Addendum)
[place] 12 wks,measurements c/w dates, subseptate uterus,normal ovaries bilat,NB present,NT 1.0 mm,crl 56.05 mm,fhr 166 bpm

## 2017-01-31 LAB — INTEGRATED 1
CROWN RUMP LENGTH MAT SCREEN: 56.1 mm
GEST. AGE ON COLLECTION DATE: 12.1 wk
Maternal Age at EDD: 30.2 yr
Nuchal Translucency (NT): 1 mm
Number of Fetuses: 1
PAPP-A Value: 1340.9 ng/mL
WEIGHT: 113 [lb_av]

## 2017-02-26 ENCOUNTER — Encounter: Payer: Self-pay | Admitting: Advanced Practice Midwife

## 2017-02-26 ENCOUNTER — Ambulatory Visit (INDEPENDENT_AMBULATORY_CARE_PROVIDER_SITE_OTHER): Payer: 59 | Admitting: Advanced Practice Midwife

## 2017-02-26 VITALS — BP 118/70 | HR 70 | Wt 116.0 lb

## 2017-02-26 DIAGNOSIS — Z1389 Encounter for screening for other disorder: Secondary | ICD-10-CM

## 2017-02-26 DIAGNOSIS — Z3A16 16 weeks gestation of pregnancy: Secondary | ICD-10-CM

## 2017-02-26 DIAGNOSIS — Z1379 Encounter for other screening for genetic and chromosomal anomalies: Secondary | ICD-10-CM

## 2017-02-26 DIAGNOSIS — Z331 Pregnant state, incidental: Secondary | ICD-10-CM

## 2017-02-26 DIAGNOSIS — Z3402 Encounter for supervision of normal first pregnancy, second trimester: Secondary | ICD-10-CM

## 2017-02-26 DIAGNOSIS — Z363 Encounter for antenatal screening for malformations: Secondary | ICD-10-CM

## 2017-02-26 LAB — POCT URINALYSIS DIPSTICK
Glucose, UA: NEGATIVE
KETONES UA: NEGATIVE
Nitrite, UA: NEGATIVE
PROTEIN UA: NEGATIVE
RBC UA: NEGATIVE

## 2017-02-26 NOTE — Patient Instructions (Signed)
Betty Acosta, I greatly value your feedback.  If you receive a survey following your visit with [place] today, we appreciate you taking the time to fill it out.  Thanks, Nigel Berthold, CNM     Second Trimester of Pregnancy The second trimester is from week 14 through week 27 (months 4 through 6). The second trimester is often a time when you feel your best. Your body has adjusted to being pregnant, and you begin to feel better physically. Usually, morning sickness has lessened or quit completely, you may have more energy, and you may have an increase in appetite. The second trimester is also a time when the fetus is growing rapidly. At the end of the sixth month, the fetus is about 9 inches long and weighs about 1 pounds. You will likely begin to feel the baby move (quickening) between 16 and 20 weeks of pregnancy. Body changes during your second trimester Your body continues to go through many changes during your second trimester. The changes vary from woman to woman.  Your weight will continue to increase. You will notice your lower abdomen bulging out.  You may begin to get stretch marks on your hips, abdomen, and breasts.  You may develop headaches that can be relieved by medicines. The medicines should be approved by your health care provider.  You may urinate more often because the fetus is pressing on your bladder.  You may develop or continue to have heartburn as a result of your pregnancy.  You may develop constipation because certain hormones are causing the muscles that push waste through your intestines to slow down.  You may develop hemorrhoids or swollen, bulging veins (varicose veins).  You may have back pain. This is caused by: ? Weight gain. ? Pregnancy hormones that are relaxing the joints in your pelvis. ? A shift in weight and the muscles that support your balance.  Your breasts will continue to grow and they will continue to become tender.  Your gums may bleed  and may be sensitive to brushing and flossing.  Dark spots or blotches (chloasma, mask of pregnancy) may develop on your face. This will likely fade after the baby is born.  A dark line from your belly button to the pubic area (linea nigra) may appear. This will likely fade after the baby is born.  You may have changes in your hair. These can include thickening of your hair, rapid growth, and changes in texture. Some women also have hair loss during or after pregnancy, or hair that feels dry or thin. Your hair will most likely return to normal after your baby is born.  What to expect at prenatal visits During a routine prenatal visit:  You will be weighed to make sure you and the fetus are growing normally.  Your blood pressure will be taken.  Your abdomen will be measured to track your baby's growth.  The fetal heartbeat will be listened to.  Any test results from the previous visit will be discussed.  Your health care provider may ask you:  How you are feeling.  If you are feeling the baby move.  If you have had any abnormal symptoms, such as leaking fluid, bleeding, severe headaches, or abdominal cramping.  If you are using any tobacco products, including cigarettes, chewing tobacco, and electronic cigarettes.  If you have any questions.  Other tests that may be performed during your second trimester include:  Blood tests that check for: ? Low iron levels (anemia). ? High  blood sugar that affects pregnant women (gestational diabetes) between 3 and 28 weeks. ? Rh antibodies. This is to check for a protein on red blood cells (Rh factor).  Urine tests to check for infections, diabetes, or protein in the urine.  An ultrasound to confirm the proper growth and development of the baby.  An amniocentesis to check for possible genetic problems.  Fetal screens for spina bifida and Down syndrome.  HIV (human immunodeficiency virus) testing. Routine prenatal testing includes  screening for HIV, unless you choose not to have this test.  Follow these instructions at home: Medicines  Follow your health care provider's instructions regarding medicine use. Specific medicines may be either safe or unsafe to take during pregnancy.  Take a prenatal vitamin that contains at least 600 micrograms (mcg) of folic acid.  If you develop constipation, try taking a stool softener if your health care provider approves. Eating and drinking  Eat a balanced diet that includes fresh fruits and vegetables, whole grains, good sources of protein such as meat, eggs, or tofu, and low-fat dairy. Your health care provider will help you determine the amount of weight gain that is right for you.  Avoid raw meat and uncooked cheese. These carry germs that can cause birth defects in the baby.  If you have low calcium intake from food, talk to your health care provider about whether you should take a daily calcium supplement.  Limit foods that are high in fat and processed sugars, such as fried and sweet foods.  To prevent constipation: ? Drink enough fluid to keep your urine clear or pale yellow. ? Eat foods that are high in fiber, such as fresh fruits and vegetables, whole grains, and beans. Activity  Exercise only as directed by your health care provider. Most women can continue their usual exercise routine during pregnancy. Try to exercise for 30 minutes at least 5 days a week. Stop exercising if you experience uterine contractions.  Avoid heavy lifting, wear low heel shoes, and practice good posture.  A sexual relationship may be continued unless your health care provider directs you otherwise. Relieving pain and discomfort  Wear a good support bra to prevent discomfort from breast tenderness.  Take warm sitz baths to soothe any pain or discomfort caused by hemorrhoids. Use hemorrhoid cream if your health care provider approves.  Rest with your legs elevated if you have leg cramps  or low back pain.  If you develop varicose veins, wear support hose. Elevate your feet for 15 minutes, 3-4 times a day. Limit salt in your diet. Prenatal Care  Write down your questions. Take them to your prenatal visits.  Keep all your prenatal visits as told by your health care provider. This is important. Safety  Wear your seat belt at all times when driving.  Make a list of emergency phone numbers, including numbers for family, friends, the hospital, and police and fire departments. General instructions  Ask your health care provider for a referral to a local prenatal education class. Begin classes no later than the beginning of month 6 of your pregnancy.  Ask for help if you have counseling or nutritional needs during pregnancy. Your health care provider can offer advice or refer you to specialists for help with various needs.  Do not use hot tubs, steam rooms, or saunas.  Do not douche or use tampons or scented sanitary pads.  Do not cross your legs for long periods of time.  Avoid cat litter boxes and soil  used by cats. These carry germs that can cause birth defects in the baby and possibly loss of the fetus by miscarriage or stillbirth.  Avoid all smoking, herbs, alcohol, and unprescribed drugs. Chemicals in these products can affect the formation and growth of the baby.  Do not use any products that contain nicotine or tobacco, such as cigarettes and e-cigarettes. If you need help quitting, ask your health care provider.  Visit your dentist if you have not gone yet during your pregnancy. Use a soft toothbrush to brush your teeth and be gentle when you floss. Contact a health care provider if:  You have dizziness.  You have mild pelvic cramps, pelvic pressure, or nagging pain in the abdominal area.  You have persistent nausea, vomiting, or diarrhea.  You have a bad smelling vaginal discharge.  You have pain when you urinate. Get help right away if:  You have a  fever.  You are leaking fluid from your vagina.  You have spotting or bleeding from your vagina.  You have severe abdominal cramping or pain.  You have rapid weight gain or weight loss.  You have shortness of breath with chest pain.  You notice sudden or extreme swelling of your face, hands, ankles, feet, or legs.  You have not felt your baby move in over an hour.  You have severe headaches that do not go away when you take medicine.  You have vision changes. Summary  The second trimester is from week 14 through week 27 (months 4 through 6). It is also a time when the fetus is growing rapidly.  Your body goes through many changes during pregnancy. The changes vary from woman to woman.  Avoid all smoking, herbs, alcohol, and unprescribed drugs. These chemicals affect the formation and growth your baby.  Do not use any tobacco products, such as cigarettes, chewing tobacco, and e-cigarettes. If you need help quitting, ask your health care provider.  Contact your health care provider if you have any questions. Keep all prenatal visits as told by your health care provider. This is important. This information is not intended to replace advice given to you by your health care provider. Make sure you discuss any questions you have with your health care provider.      CHILDBIRTH CLASSES [phone] is the phone number for Pregnancy Classes or hospital tours at [place] will be referred to  [email or web address] for more information on childbirth classes  At this site you may register for classes. You may sign up for a waiting list if classes are full. Please SIGN UP FOR THIS!.   When the waiting list becomes long, sometimes new classes can be added.

## 2017-02-26 NOTE — Progress Notes (Signed)
LOW-RISK PREGNANCY VISIT Patient name: Makynzie Dobesh MRN [id number]  Date of birth: 01-14-1986 Chief Complaint:   Routine Prenatal Visit (2nd IT)  History of Present Illness:   Betty Acosta is a 31 y.o. G1P0 female at [redacted]w[redacted]d with an Estimated Date of Delivery: 08/13/17 being seen today for ongoing management of a low-risk pregnancy.  Today she reports no complaints.  . Vag. Bleeding: None.   .no bleedig from [facility] in >1 weeks denies leaking of fluid. Review of Systems:   Pertinent items are noted in HPI Denies abnormal vaginal discharge w/ itching/odor/irritation, headaches, visual changes, shortness of breath, chest pain, abdominal pain, severe nausea/vomiting, or problems with urination or bowel movements unless otherwise stated above.  Pertinent History Reviewed:  Medical & Surgical Hx:   Past Medical History:  Diagnosis Date  . Anxiety   . Asthma   . IBS (irritable bowel syndrome)   . Vaginal Pap smear, abnormal    Past Surgical History:  Procedure Laterality Date  . LEEP     Family History  Problem Relation Age of Onset  . Diabetes Maternal Aunt   . Hypertension Maternal Aunt   . Diabetes Maternal Grandmother   . Drug abuse Maternal Grandmother   . Hypertension Maternal Grandmother   . Congestive Heart Failure Mother   . Hypertension Mother   . Fibromyalgia Mother   . Other Mother        heart blockages  . Depression Mother   . Anxiety disorder Mother   . Hypertension Sister   . Other Sister        broderline diabetes  . ADD / ADHD Brother   . Autism Brother   . Heart attack Maternal Grandfather   . Cancer Paternal Grandmother   . ADD / ADHD Brother     Current Outpatient Medications:  .  Prenatal Vit-Fe Fumarate-FA (MULTIVITAMIN-PRENATAL) 27-0.8 MG TABS tablet, Take 1 tablet by mouth daily at 12 noon., Disp: , Rfl:  .  Doxylamine-Pyridoxine 10-10 MG TBEC, 2 PO qhs; may take 1po in am and 1po in afternoon prn nausea (Patient not taking: Reported on 01/29/2017), Disp:  120 tablet, Rfl: 3 .  promethazine (PHENERGAN) 25 MG tablet, Take 1 tablet (25 mg total) by mouth every 6 (six) hours as needed for nausea or vomiting. (Patient not taking: Reported on 01/29/2017), Disp: 30 tablet, Rfl: 1 Social History: Reviewed -  reports that  has never smoked. she has never used smokeless tobacco.  Physical Assessment:   Vitals:   02/26/17 1416  BP: 118/70  Pulse: 70  Weight: 116 lb (52.6 kg)  Body mass index is 21.22 kg/m.        Physical Examination:   General appearance: Well appearing, and in no distress  Mental status: Alert, oriented to person, place, and time  Skin: Warm & dry  Cardiovascular: Normal heart rate noted  Respiratory: Normal respiratory effort, no distress  Abdomen: Soft, gravid, nontender  Pelvic: Cervical exam deferred         Extremities: Edema: None  Fetal Status:          Results for orders placed or performed in visit on 02/26/17 (from the past 24 hour(s))  POCT urinalysis dipstick   Collection Time: 02/26/17  2:22 PM  Result Value Ref Range   Color, UA     Clarity, UA     Glucose, UA neg    Bilirubin, UA     Ketones, UA neg    Spec Grav, UA  1.010 -  1.025   Blood, UA neg    pH, UA  5.0 - 8.0   Protein, UA neg    Urobilinogen, UA  0.2 or 1.0 E.U./dL   Nitrite, UA neg    Leukocytes, UA Moderate (2+) (A) Negative    Assessment & Plan:  1) Low-risk pregnancy G1P0 at [redacted]w[redacted]d with an Estimated Date of Delivery: 08/13/17   2) ,    Labs/procedures/US today: 2nd IT  Plan:  Continue routine obstetrical care    Follow-up: Return in about 2 weeks (around 03/13/2017) for [place], OF:[id number].  Orders Placed This Encounter  Procedures  . US OB Comp + 14 Wk  . INTEGRATED 2  . POCT urinalysis dipstick   CRESENZO-DISHMAN,Dorance Spink CNM 02/26/2017 2:36 PM

## 2017-03-03 LAB — INTEGRATED 2
AFP MoM: 1.43
Alpha-Fetoprotein: 51.6 ng/mL
Crown Rump Length: 56.1 mm
DIA MOM: 2.55
DIA VALUE: 521.5 pg/mL
Estriol, Unconjugated: 0.62 ng/mL
Gest. Age on Collection Date: 12.1 weeks
Gestational Age: 16.1 weeks
Maternal Age at EDD: 30.2 yr
NUCHAL TRANSLUCENCY (NT): 1 mm
NUCHAL TRANSLUCENCY MOM: 0.68
Number of Fetuses: 1
PAPP-A MoM: 1.19
PAPP-A VALUE: 1340.9 ng/mL
Test Results:: NEGATIVE
Weight: 113 [lb_av]
Weight: 113 [lb_av]
hCG MoM: 3.61
hCG Value: 138.5 IU/mL
uE3 MoM: 0.72

## 2017-03-09 ENCOUNTER — Telehealth: Payer: Self-pay | Admitting: *Deleted

## 2017-03-09 NOTE — Telephone Encounter (Signed)
Informed patient hydrocodone was fine to take for pain post dental work.

## 2017-03-12 ENCOUNTER — Encounter: Payer: Self-pay | Admitting: Advanced Practice Midwife

## 2017-03-12 ENCOUNTER — Ambulatory Visit (INDEPENDENT_AMBULATORY_CARE_PROVIDER_SITE_OTHER): Payer: 59

## 2017-03-12 ENCOUNTER — Ambulatory Visit (INDEPENDENT_AMBULATORY_CARE_PROVIDER_SITE_OTHER): Payer: 59 | Admitting: Advanced Practice Midwife

## 2017-03-12 VITALS — BP 100/60 | HR 98 | Wt 117.0 lb

## 2017-03-12 DIAGNOSIS — Z3402 Encounter for supervision of normal first pregnancy, second trimester: Secondary | ICD-10-CM

## 2017-03-12 DIAGNOSIS — Z331 Pregnant state, incidental: Secondary | ICD-10-CM

## 2017-03-12 DIAGNOSIS — Z1389 Encounter for screening for other disorder: Secondary | ICD-10-CM

## 2017-03-12 DIAGNOSIS — Z363 Encounter for antenatal screening for malformations: Secondary | ICD-10-CM

## 2017-03-12 DIAGNOSIS — Z3A18 18 weeks gestation of pregnancy: Secondary | ICD-10-CM

## 2017-03-12 LAB — POCT URINALYSIS DIPSTICK
GLUCOSE UA: NEGATIVE
Ketones, UA: NEGATIVE
LEUKOCYTES UA: NEGATIVE
Nitrite, UA: NEGATIVE
Protein, UA: NEGATIVE
RBC UA: NEGATIVE

## 2017-03-12 NOTE — Progress Notes (Signed)
[place] 18 wks,cephalic,fundal left pl gr 0,bilat adnexa's wnl,cx 3.1 cm,svp of fluid 4 cm,fhr 148 bpm,efw 221 g,anatomy complete,no obvious abnormalities

## 2017-03-12 NOTE — Progress Notes (Signed)
G1P0 [redacted]w[redacted]d Estimated Date of Delivery: 08/13/17  Last menstrual period 11/06/2016.   BP weight and urine results all reviewed and noted.  Please refer to the obstetrical flow sheet for the fundal height and fetal heart rate documentation:  [place] 18 wks,cephalic,fundal left pl gr 0,bilat adnexa's wnl,cx 3.1 cm,svp of fluid 4 cm,fhr 148 bpm,efw 221 g,anatomy complete,no obvious abnormalities   Patient denies any bleeding and no rupture of membranes symptoms or regular contractions. Patient is without complaints. All questions were answered.  No orders of the defined types were placed in this encounter.   Plan:  Continued routine obstetrical care,   Return in about 4 weeks (around 04/09/2017) for [place].

## 2017-03-12 NOTE — Patient Instructions (Signed)
Betty Acosta, I greatly value your feedback.  If you receive a survey following your visit with [place] today, we appreciate you taking the time to fill it out.  Thanks, Nigel Berthold, CNM     Second Trimester of Pregnancy The second trimester is from week 14 through week 27 (months 4 through 6). The second trimester is often a time when you feel your best. Your body has adjusted to being pregnant, and you begin to feel better physically. Usually, morning sickness has lessened or quit completely, you may have more energy, and you may have an increase in appetite. The second trimester is also a time when the fetus is growing rapidly. At the end of the sixth month, the fetus is about 9 inches long and weighs about 1 pounds. You will likely begin to feel the baby move (quickening) between 16 and 20 weeks of pregnancy. Body changes during your second trimester Your body continues to go through many changes during your second trimester. The changes vary from woman to woman.  Your weight will continue to increase. You will notice your lower abdomen bulging out.  You may begin to get stretch marks on your hips, abdomen, and breasts.  You may develop headaches that can be relieved by medicines. The medicines should be approved by your health care provider.  You may urinate more often because the fetus is pressing on your bladder.  You may develop or continue to have heartburn as a result of your pregnancy.  You may develop constipation because certain hormones are causing the muscles that push waste through your intestines to slow down.  You may develop hemorrhoids or swollen, bulging veins (varicose veins).  You may have back pain. This is caused by: ? Weight gain. ? Pregnancy hormones that are relaxing the joints in your pelvis. ? A shift in weight and the muscles that support your balance.  Your breasts will continue to grow and they will continue to become tender.  Your gums may bleed  and may be sensitive to brushing and flossing.  Dark spots or blotches (chloasma, mask of pregnancy) may develop on your face. This will likely fade after the baby is born.  A dark line from your belly button to the pubic area (linea nigra) may appear. This will likely fade after the baby is born.  You may have changes in your hair. These can include thickening of your hair, rapid growth, and changes in texture. Some women also have hair loss during or after pregnancy, or hair that feels dry or thin. Your hair will most likely return to normal after your baby is born.  What to expect at prenatal visits During a routine prenatal visit:  You will be weighed to make sure you and the fetus are growing normally.  Your blood pressure will be taken.  Your abdomen will be measured to track your baby's growth.  The fetal heartbeat will be listened to.  Any test results from the previous visit will be discussed.  Your health care provider may ask you:  How you are feeling.  If you are feeling the baby move.  If you have had any abnormal symptoms, such as leaking fluid, bleeding, severe headaches, or abdominal cramping.  If you are using any tobacco products, including cigarettes, chewing tobacco, and electronic cigarettes.  If you have any questions.  Other tests that may be performed during your second trimester include:  Blood tests that check for: ? Low iron levels (anemia). ? High  blood sugar that affects pregnant women (gestational diabetes) between 3 and 28 weeks. ? Rh antibodies. This is to check for a protein on red blood cells (Rh factor).  Urine tests to check for infections, diabetes, or protein in the urine.  An ultrasound to confirm the proper growth and development of the baby.  An amniocentesis to check for possible genetic problems.  Fetal screens for spina bifida and Down syndrome.  HIV (human immunodeficiency virus) testing. Routine prenatal testing includes  screening for HIV, unless you choose not to have this test.  Follow these instructions at home: Medicines  Follow your health care provider's instructions regarding medicine use. Specific medicines may be either safe or unsafe to take during pregnancy.  Take a prenatal vitamin that contains at least 600 micrograms (mcg) of folic acid.  If you develop constipation, try taking a stool softener if your health care provider approves. Eating and drinking  Eat a balanced diet that includes fresh fruits and vegetables, whole grains, good sources of protein such as meat, eggs, or tofu, and low-fat dairy. Your health care provider will help you determine the amount of weight gain that is right for you.  Avoid raw meat and uncooked cheese. These carry germs that can cause birth defects in the baby.  If you have low calcium intake from food, talk to your health care provider about whether you should take a daily calcium supplement.  Limit foods that are high in fat and processed sugars, such as fried and sweet foods.  To prevent constipation: ? Drink enough fluid to keep your urine clear or pale yellow. ? Eat foods that are high in fiber, such as fresh fruits and vegetables, whole grains, and beans. Activity  Exercise only as directed by your health care provider. Most women can continue their usual exercise routine during pregnancy. Try to exercise for 30 minutes at least 5 days a week. Stop exercising if you experience uterine contractions.  Avoid heavy lifting, wear low heel shoes, and practice good posture.  A sexual relationship may be continued unless your health care provider directs you otherwise. Relieving pain and discomfort  Wear a good support bra to prevent discomfort from breast tenderness.  Take warm sitz baths to soothe any pain or discomfort caused by hemorrhoids. Use hemorrhoid cream if your health care provider approves.  Rest with your legs elevated if you have leg cramps  or low back pain.  If you develop varicose veins, wear support hose. Elevate your feet for 15 minutes, 3-4 times a day. Limit salt in your diet. Prenatal Care  Write down your questions. Take them to your prenatal visits.  Keep all your prenatal visits as told by your health care provider. This is important. Safety  Wear your seat belt at all times when driving.  Make a list of emergency phone numbers, including numbers for family, friends, the hospital, and police and fire departments. General instructions  Ask your health care provider for a referral to a local prenatal education class. Begin classes no later than the beginning of month 6 of your pregnancy.  Ask for help if you have counseling or nutritional needs during pregnancy. Your health care provider can offer advice or refer you to specialists for help with various needs.  Do not use hot tubs, steam rooms, or saunas.  Do not douche or use tampons or scented sanitary pads.  Do not cross your legs for long periods of time.  Avoid cat litter boxes and soil  used by cats. These carry germs that can cause birth defects in the baby and possibly loss of the fetus by miscarriage or stillbirth.  Avoid all smoking, herbs, alcohol, and unprescribed drugs. Chemicals in these products can affect the formation and growth of the baby.  Do not use any products that contain nicotine or tobacco, such as cigarettes and e-cigarettes. If you need help quitting, ask your health care provider.  Visit your dentist if you have not gone yet during your pregnancy. Use a soft toothbrush to brush your teeth and be gentle when you floss. Contact a health care provider if:  You have dizziness.  You have mild pelvic cramps, pelvic pressure, or nagging pain in the abdominal area.  You have persistent nausea, vomiting, or diarrhea.  You have a bad smelling vaginal discharge.  You have pain when you urinate. Get help right away if:  You have a  fever.  You are leaking fluid from your vagina.  You have spotting or bleeding from your vagina.  You have severe abdominal cramping or pain.  You have rapid weight gain or weight loss.  You have shortness of breath with chest pain.  You notice sudden or extreme swelling of your face, hands, ankles, feet, or legs.  You have not felt your baby move in over an hour.  You have severe headaches that do not go away when you take medicine.  You have vision changes. Summary  The second trimester is from week 14 through week 27 (months 4 through 6). It is also a time when the fetus is growing rapidly.  Your body goes through many changes during pregnancy. The changes vary from woman to woman.  Avoid all smoking, herbs, alcohol, and unprescribed drugs. These chemicals affect the formation and growth your baby.  Do not use any tobacco products, such as cigarettes, chewing tobacco, and e-cigarettes. If you need help quitting, ask your health care provider.  Contact your health care provider if you have any questions. Keep all prenatal visits as told by your health care provider. This is important. This information is not intended to replace advice given to you by your health care provider. Make sure you discuss any questions you have with your health care provider.      CHILDBIRTH CLASSES [phone] is the phone number for Pregnancy Classes or hospital tours at [place] will be referred to  [email or web address] for more information on childbirth classes  At this site you may register for classes. You may sign up for a waiting list if classes are full. Please SIGN UP FOR THIS!.   When the waiting list becomes long, sometimes new classes can be added.

## 2017-03-24 NOTE — L&D Delivery Note (Addendum)
Delivery Note Betty Acosta is a 32 y.o. G1P0 at [redacted]w[redacted]d admitted for spontaneous labor.  Labor course: progressed w/o augmentation. Developed fever of 100.1Ax & fetal tachycardia @ 2352 prior to ROM, she was tx w/ APAP, amp and gent per protocol. 10cm @ 0130, began pushing around 0208, SROM large amt clear fluid at 0215. NICU called to be in attendance of birth d/t chorio and fetal tachycardia w/ mod variables towards end of pushing. Pt pushed very well, and at 0338 a viable female was delivered via spontaneous vaginal delivery (Presentation: LOA) with tight nuchal reduced immediately after delivery.  Non-vigorous infant placed directly on mom's abdomen, Dr. Tora Kindred at bedside for stimulation/bulb suction. Delayed cord clamping not performed. Cord clamped x 2 and cut by me, and infant taken to radiant warmer by Dr. Tora Kindred. APGAR: 2/5/6 ; weight: pending at time of note.  40 units of pitocin diluted in 1000cc LR was infused rapidly IV per protocol. The placenta was slow to release, delivered at almost 76min by placing hand inside vagina w/ gentle traction on placenta as pt pushed.  Velamentous cord insertion, lobulated irregularly shaped placenta- likely d/t subseptate uterus. Placenta was inspected by myself and Dr. Nehemiah Settle and appears to be intact with a 3 VC.  Placenta/Cord with the following complications: as above .  Cord pH: 7.055 arterial, 7.141 venous.    Intrapartum complications:  Chorio Anesthesia:  epidural Episiotomy: none Lacerations:  Bilateral periurethral, 2nd degree, Rt sulcal- repaired by Dr. Nehemiah Settle Suture Repair: 3.0 monocryl Est. Blood Loss (mL): 500 Sponge and instrument count were correct x2.  Mom to postpartum.  Baby to NICU. Placenta to pathology. Plans to breastfeed Contraception: undecided Circ: yes  Roma Schanz CNM, [facility] 07/29/2017 5:13 AM   Gertie Exon, Royetta Crochet, CNM  [place] Kung, RMA        Please schedule this patient for PP visit in: 4 weeks  Low risk  pregnancy complicated by: none  Delivery mode: SVD  Anticipated Birth Control: undecided  PP Procedures needed: none  Schedule Integrated BH visit: no  Provider: with Knute Neu

## 2017-04-09 ENCOUNTER — Encounter: Payer: Self-pay | Admitting: Women's Health

## 2017-04-09 ENCOUNTER — Ambulatory Visit (INDEPENDENT_AMBULATORY_CARE_PROVIDER_SITE_OTHER): Payer: 59 | Admitting: Women's Health

## 2017-04-09 VITALS — BP 114/62 | HR 84 | Wt 123.0 lb

## 2017-04-09 DIAGNOSIS — Z331 Pregnant state, incidental: Secondary | ICD-10-CM

## 2017-04-09 DIAGNOSIS — Z1389 Encounter for screening for other disorder: Secondary | ICD-10-CM

## 2017-04-09 DIAGNOSIS — N760 Acute vaginitis: Secondary | ICD-10-CM

## 2017-04-09 DIAGNOSIS — O26852 Spotting complicating pregnancy, second trimester: Secondary | ICD-10-CM

## 2017-04-09 DIAGNOSIS — O23592 Infection of other part of genital tract in pregnancy, second trimester: Secondary | ICD-10-CM

## 2017-04-09 DIAGNOSIS — Z3402 Encounter for supervision of normal first pregnancy, second trimester: Secondary | ICD-10-CM

## 2017-04-09 DIAGNOSIS — N841 Polyp of cervix uteri: Secondary | ICD-10-CM | POA: Insufficient documentation

## 2017-04-09 DIAGNOSIS — Z3A22 22 weeks gestation of pregnancy: Secondary | ICD-10-CM

## 2017-04-09 LAB — POCT WET PREP (WET MOUNT)
Clue Cells Wet Prep Whiff POC: POSITIVE
TRICHOMONAS WET PREP HPF POC: ABSENT

## 2017-04-09 LAB — POCT URINALYSIS DIPSTICK
Blood, UA: NEGATIVE
Glucose, UA: NEGATIVE
KETONES UA: NEGATIVE
Leukocytes, UA: NEGATIVE
Nitrite, UA: NEGATIVE
Protein, UA: NEGATIVE

## 2017-04-09 MED ORDER — METRONIDAZOLE 500 MG PO TABS: 500.0000 mg | ORAL_TABLET | Freq: Two times a day (BID) | ORAL | 0 refills | Status: DC

## 2017-04-09 NOTE — Progress Notes (Signed)
LOW-RISK PREGNANCY VISIT Patient name: Betty Acosta MRN [id number]  Date of birth: 12/25/1985 Chief Complaint:   Routine Prenatal Visit  History of Present Illness:   Betty Acosta is a 32 y.o. G1P0 female at [redacted]w[redacted]d with an Estimated Date of Delivery: 08/13/17 being seen today for ongoing management of a low-risk pregnancy.  Today she reports occ random spotting, last time was yesterday and this am, small amt bright red. . Denies abnormal discharge, itching/odor/irritation.    . Vag. Bleeding: None.  Movement: Present. denies leaking of fluid. Review of Systems:   Pertinent items are noted in HPI Denies abnormal vaginal discharge w/ itching/odor/irritation, headaches, visual changes, shortness of breath, chest pain, abdominal pain, severe nausea/vomiting, or problems with urination or bowel movements unless otherwise stated above. Pertinent History Reviewed:  Reviewed past medical,surgical, social, obstetrical and family history.  Reviewed problem list, medications and allergies. Physical Assessment:   Vitals:   04/09/17 1023  BP: 114/62  Pulse: 84  Weight: 123 lb (55.8 kg)  Body mass index is 22.5 kg/m.        Physical Examination:   General appearance: Well appearing, and in no distress  Mental status: Alert, oriented to person, place, and time  Skin: Warm & dry  Cardiovascular: Normal heart rate noted  Respiratory: Normal respiratory effort, no distress  Abdomen: Soft, gravid, nontender  Pelvic: spec exam: cx visually closed, cervical polyp appears to be in center of os, doesn't bleed when touched w/ Q-tip. Small amt light pink malodorous d/c         Extremities: Edema: None  Fetal Status: Fetal Heart Rate (bpm): 146 Fundal Height: 22 cm Movement: Present    Results for orders placed or performed in visit on 04/09/17 (from the past 24 hour(s))  POCT Urinalysis Dipstick   Collection Time: 04/09/17 10:28 AM  Result Value Ref Range   Color, UA     Clarity, UA     Glucose, UA neg     Bilirubin, UA     Ketones, UA neg    Spec Grav, UA  1.010 - 1.025   Blood, UA neg    pH, UA  5.0 - 8.0   Protein, UA neg    Urobilinogen, UA  0.2 or 1.0 E.U./dL   Nitrite, UA neg    Leukocytes, UA Negative Negative   Appearance     Odor    POCT Wet Prep [place])   Collection Time: 04/09/17 10:52 AM  Result Value Ref Range   Source Wet Prep POC vaginal    WBC, Wet Prep HPF POC none    Bacteria Wet Prep HPF POC Few Few   BACTERIA WET PREP MORPHOLOGY POC     Clue Cells Wet Prep HPF POC Many (A) None   Clue Cells Wet Prep Whiff POC Positive Whiff    Yeast Wet Prep HPF POC None    KOH Wet Prep POC     Trichomonas Wet Prep HPF POC Absent Absent    Assessment & Plan:  1) Low-risk pregnancy G1P0 at [redacted]w[redacted]d with an Estimated Date of Delivery: 08/13/17   2) BV, likely cause of her spotting, Rx metronidazole 500mg  BID x 7d for BV, no sex while taking   3) Cervical polyp> discussed w/ J[facility], re-assess pp  4) Occ spotting> discussed likely from BV, however she also had a small [place] at 7wks- not seen at anatomy u/s, and also has cervical polyp   Meds:  Meds ordered this encounter  Medications  .  metroNIDAZOLE (FLAGYL) 500 MG tablet    Sig: Take 1 tablet (500 mg total) by mouth 2 (two) times daily.    Dispense:  14 tablet    Refill:  0    Order Specific Question:   Supervising Provider    Answer:   Florian Buff [[id number]]   Labs/procedures today: wet prep  Plan:  Continue routine obstetrical care   Reviewed: Preterm labor symptoms and general obstetric precautions including but not limited to vaginal bleeding, contractions, leaking of fluid and fetal movement were reviewed in detail with the patient.  All questions were answered  Follow-up: Return in about 4 weeks (around 05/07/2017) for LROB, PN2.  Orders Placed This Encounter  Procedures  . POCT Urinalysis Dipstick  . POCT [employer] Prep ([address] [place])   Tawnya Crook CNM, [facility] 04/09/2017 11:02 AM

## 2017-04-09 NOTE — Patient Instructions (Signed)
Emerson Monte, I greatly value your feedback.  If you receive a survey following your visit with [place] today, we appreciate you taking the time to fill it out.  Thanks, Knute Neu, CNM, WHNP-BC   You will have your sugar test next visit.  Please do not eat or drink anything after midnight the night before you come, not even water.  You will be here for at least two hours.     Call the office [phone]) or go to [facility] if:  You begin to have strong, frequent contractions  Your water breaks.  Sometimes it is a big gush of fluid, sometimes it is just a trickle that keeps getting your panties wet or running down your legs  You have vaginal bleeding.  It is normal to have a small amount of spotting if your cervix was checked.   You don't feel your baby moving like normal.  If you don't, get you something to eat and drink and lay down and focus on feeling your baby move.   If your baby is still not moving like normal, you should call the office or go to [place] [facility] P[facility] The second trimester is from week 13 through week 28, months 4 through 6. The second trimester is often a time when you feel your best. Your body has also adjusted to being pregnant, and you begin to feel better physically. Usually, morning sickness has lessened or quit completely, you may have more energy, and you may have an increase in appetite. The second trimester is also a time when the fetus is growing rapidly. At the end of the sixth month, the fetus is about 9 inches long and weighs about 1 pounds. You will likely begin to feel the baby move (quickening) between 18 and 20 weeks of the pregnancy. BODY CHANGES Your body goes through many changes during pregnancy. The changes vary from woman to woman.   Your weight will continue to increase. You will notice your lower abdomen bulging out.  You may begin to get stretch marks on your hips, abdomen, and breasts.  You may develop headaches  that can be relieved by medicines approved by your health care provider.  You may urinate more often because the fetus is pressing on your bladder.  You may develop or continue to have heartburn as a result of your pregnancy.  You may develop constipation because certain hormones are causing the muscles that push waste through your intestines to slow down.  You may develop hemorrhoids or swollen, bulging veins (varicose veins).  You may have back pain because of the weight gain and pregnancy hormones relaxing your joints between the bones in your pelvis and as a result of a shift in weight and the muscles that support your balance.  Your breasts will continue to grow and be tender.  Your gums may bleed and may be sensitive to brushing and flossing.  Dark spots or blotches (chloasma, mask of pregnancy) may develop on your face. This will likely fade after the baby is born.  A dark line from your belly button to the pubic area (linea nigra) may appear. This will likely fade after the baby is born.  You may have changes in your hair. These can include thickening of your hair, rapid growth, and changes in texture. Some women also have hair loss during or after pregnancy, or hair that feels dry or thin. Your hair will most likely return to normal after your baby  is born. WHAT TO EXPECT AT YOUR PRENATAL VISITS During a routine prenatal visit:  You will be weighed to make sure you and the fetus are growing normally.  Your blood pressure will be taken.  Your abdomen will be measured to track your baby's growth.  The fetal heartbeat will be listened to.  Any test results from the previous visit will be discussed. Your health care provider may ask you:  How you are feeling.  If you are feeling the baby move.  If you have had any abnormal symptoms, such as leaking fluid, bleeding, severe headaches, or abdominal cramping.  If you have any questions. Other tests that may be performed  during your second trimester include:  Blood tests that check for:  Low iron levels (anemia).  Gestational diabetes (between 24 and 28 weeks).  Rh antibodies.  Urine tests to check for infections, diabetes, or protein in the urine.  An ultrasound to confirm the proper growth and development of the baby.  An amniocentesis to check for possible genetic problems.  Fetal screens for spina bifida and Down syndrome. HOME CARE INSTRUCTIONS   Avoid all smoking, herbs, alcohol, and unprescribed drugs. These chemicals affect the formation and growth of the baby.  Follow your health care provider's instructions regarding medicine use. There are medicines that are either safe or unsafe to take during pregnancy.  Exercise only as directed by your health care provider. Experiencing uterine cramps is a good sign to stop exercising.  Continue to eat regular, healthy meals.  Wear a good support bra for breast tenderness.  Do not use hot tubs, steam rooms, or saunas.  Wear your seat belt at all times when driving.  Avoid raw meat, uncooked cheese, cat litter boxes, and soil used by cats. These carry germs that can cause birth defects in the baby.  Take your prenatal vitamins.  Try taking a stool softener (if your health care provider approves) if you develop constipation. Eat more high-fiber foods, such as fresh vegetables or fruit and whole grains. Drink plenty of fluids to keep your urine clear or pale yellow.  Take warm sitz baths to soothe any pain or discomfort caused by hemorrhoids. Use hemorrhoid cream if your health care provider approves.  If you develop varicose veins, wear support hose. Elevate your feet for 15 minutes, 3-4 times a day. Limit salt in your diet.  Avoid heavy lifting, wear low heel shoes, and practice good posture.  Rest with your legs elevated if you have leg cramps or low back pain.  Visit your dentist if you have not gone yet during your pregnancy. Use a soft  toothbrush to brush your teeth and be gentle when you floss.  A sexual relationship may be continued unless your health care provider directs you otherwise.  Continue to go to all your prenatal visits as directed by your health care provider. SEEK MEDICAL CARE IF:   You have dizziness.  You have mild pelvic cramps, pelvic pressure, or nagging pain in the abdominal area.  You have persistent nausea, vomiting, or diarrhea.  You have a bad smelling vaginal discharge.  You have pain with urination. SEEK IMMEDIATE MEDICAL CARE IF:   You have a fever.  You are leaking fluid from your vagina.  You have spotting or bleeding from your vagina.  You have severe abdominal cramping or pain.  You have rapid weight gain or loss.  You have shortness of breath with chest pain.  You notice sudden or extreme swelling  of your face, hands, ankles, feet, or legs.  You have not felt your baby move in over an hour.  You have severe headaches that do not go away with medicine.  You have vision changes. Document Released: 03/04/2001 Document Revised: 03/15/2013 Document Reviewed: 05/11/2012 [facility] Patient Information 2015 [place], [place]. This information is not intended to replace advice given to you by your health care provider. Make sure you discuss any questions you have with your health care provider.

## 2017-05-07 ENCOUNTER — Other Ambulatory Visit: Payer: 59

## 2017-05-07 ENCOUNTER — Other Ambulatory Visit: Payer: Self-pay

## 2017-05-07 ENCOUNTER — Encounter: Payer: Self-pay | Admitting: Obstetrics & Gynecology

## 2017-05-07 ENCOUNTER — Ambulatory Visit (INDEPENDENT_AMBULATORY_CARE_PROVIDER_SITE_OTHER): Payer: 59 | Admitting: Obstetrics & Gynecology

## 2017-05-07 VITALS — BP 92/58 | HR 65 | Wt 125.0 lb

## 2017-05-07 DIAGNOSIS — Z331 Pregnant state, incidental: Secondary | ICD-10-CM

## 2017-05-07 DIAGNOSIS — Z131 Encounter for screening for diabetes mellitus: Secondary | ICD-10-CM

## 2017-05-07 DIAGNOSIS — Z3402 Encounter for supervision of normal first pregnancy, second trimester: Secondary | ICD-10-CM

## 2017-05-07 DIAGNOSIS — Z1389 Encounter for screening for other disorder: Secondary | ICD-10-CM

## 2017-05-07 DIAGNOSIS — Z3A26 26 weeks gestation of pregnancy: Secondary | ICD-10-CM

## 2017-05-07 LAB — POCT URINALYSIS DIPSTICK
GLUCOSE UA: NEGATIVE
Ketones, UA: NEGATIVE
LEUKOCYTES UA: NEGATIVE
NITRITE UA: NEGATIVE
PROTEIN UA: NEGATIVE
RBC UA: NEGATIVE

## 2017-05-07 NOTE — Progress Notes (Signed)
G1P0 [redacted]w[redacted]d Estimated Date of Delivery: 08/13/17  Blood pressure (!) 92/58, pulse 65, weight 125 lb (56.7 kg), last menstrual period 11/06/2016.   BP weight and urine results all reviewed and noted.  Please refer to the obstetrical flow sheet for the fundal height and fetal heart rate documentation:  Patient reports good fetal movement, denies any bleeding and no rupture of membranes symptoms or regular contractions. Patient is without complaints. All questions were answered.  Orders Placed This Encounter  Procedures  . POCT urinalysis dipstick    Plan:  Continued routine obstetrical care, PN2 today  Return in about 3 weeks (around 05/28/2017) for LROB.

## 2017-05-08 LAB — GLUCOSE TOLERANCE, 2 HOURS W/ 1HR
GLUCOSE, 1 HOUR: 122 mg/dL (ref 65–179)
GLUCOSE, FASTING: 74 mg/dL (ref 65–91)
Glucose, 2 hour: 107 mg/dL (ref 65–152)

## 2017-05-08 LAB — CBC
Hematocrit: 32.6 % — ABNORMAL LOW (ref 34.0–46.6)
Hemoglobin: 10.9 g/dL — ABNORMAL LOW (ref 11.1–15.9)
MCH: 32.1 pg (ref 26.6–33.0)
MCHC: 33.4 g/dL (ref 31.5–35.7)
MCV: 96 fL (ref 79–97)
PLATELETS: 216 10*3/uL (ref 150–379)
RBC: 3.4 x10E6/uL — ABNORMAL LOW (ref 3.77–5.28)
RDW: 13.5 % (ref 12.3–15.4)
WBC: 8.5 10*3/uL (ref 3.4–10.8)

## 2017-05-08 LAB — HIV ANTIBODY (ROUTINE TESTING W REFLEX): HIV Screen 4th Generation wRfx: NONREACTIVE

## 2017-05-08 LAB — RPR: RPR Ser Ql: NONREACTIVE

## 2017-05-08 LAB — ANTIBODY SCREEN: Antibody Screen: NEGATIVE

## 2017-05-13 ENCOUNTER — Telehealth: Payer: Self-pay | Admitting: Obstetrics & Gynecology

## 2017-05-13 NOTE — Telephone Encounter (Signed)
Informed patient she passed her glucola. Verbalized gratitude.

## 2017-05-28 ENCOUNTER — Encounter: Payer: Self-pay | Admitting: Women's Health

## 2017-05-28 ENCOUNTER — Ambulatory Visit (INDEPENDENT_AMBULATORY_CARE_PROVIDER_SITE_OTHER): Payer: 59 | Admitting: Women's Health

## 2017-05-28 VITALS — BP 100/60 | HR 98 | Wt 127.2 lb

## 2017-05-28 DIAGNOSIS — Z3A29 29 weeks gestation of pregnancy: Secondary | ICD-10-CM

## 2017-05-28 DIAGNOSIS — Z331 Pregnant state, incidental: Secondary | ICD-10-CM

## 2017-05-28 DIAGNOSIS — Z3403 Encounter for supervision of normal first pregnancy, third trimester: Secondary | ICD-10-CM

## 2017-05-28 DIAGNOSIS — Z1389 Encounter for screening for other disorder: Secondary | ICD-10-CM

## 2017-05-28 LAB — POCT URINALYSIS DIPSTICK
Blood, UA: NEGATIVE
Glucose, UA: NEGATIVE
KETONES UA: NEGATIVE
Leukocytes, UA: NEGATIVE
NITRITE UA: NEGATIVE
PROTEIN UA: NEGATIVE

## 2017-05-28 NOTE — Progress Notes (Signed)
   LOW-RISK PREGNANCY VISIT Patient name: Betty Acosta MRN [id number]  Date of birth: 1985-06-04 Chief Complaint:   Routine Prenatal Visit  History of Present Illness:   Betty Acosta is a 32 y.o. G1P0 female at [redacted]w[redacted]d with an Estimated Date of Delivery: 08/13/17 being seen today for ongoing management of a low-risk pregnancy.  Today she reports no complaints. Contractions: Not present.  .  Movement: Present. denies leaking of fluid. Review of Systems:   Pertinent items are noted in HPI Denies abnormal vaginal discharge w/ itching/odor/irritation, headaches, visual changes, shortness of breath, chest pain, abdominal pain, severe nausea/vomiting, or problems with urination or bowel movements unless otherwise stated above. Pertinent History Reviewed:  Reviewed past medical,surgical, social, obstetrical and family history.  Reviewed problem list, medications and allergies. Physical Assessment:   Vitals:   05/28/17 1053  BP: 100/60  Pulse: 98  Weight: 127 lb 3.2 oz (57.7 kg)  Body mass index is 23.27 kg/m.        Physical Examination:   General appearance: Well appearing, and in no distress  Mental status: Alert, oriented to person, place, and time  Skin: Warm & dry  Cardiovascular: Normal heart rate noted  Respiratory: Normal respiratory effort, no distress  Abdomen: Soft, gravid, nontender  Pelvic: Cervical exam deferred         Extremities: Edema: None  Fetal Status: Fetal Heart Rate (bpm): 144 Fundal Height: 27 cm Movement: Present    Results for orders placed or performed in visit on 05/28/17 (from the past 24 hour(s))  POCT urinalysis dipstick   Collection Time: 05/28/17 10:57 AM  Result Value Ref Range   Color, UA     Clarity, UA     Glucose, UA neg    Bilirubin, UA     Ketones, UA neg    Spec Grav, UA  1.010 - 1.025   Blood, UA neg    pH, UA  5.0 - 8.0   Protein, UA neg    Urobilinogen, UA  0.2 or 1.0 E.U./dL   Nitrite, UA neg    Leukocytes, UA Negative Negative   Appearance     Odor      Assessment & Plan:  1) Low-risk pregnancy G1P0 at [redacted]w[redacted]d with an Estimated Date of Delivery: 08/13/17    Meds: No orders of the defined types were placed in this encounter.  Labs/procedures today: none  Plan:  Continue routine obstetrical care   Reviewed: Preterm labor symptoms and general obstetric precautions including but not limited to vaginal bleeding, contractions, leaking of fluid and fetal movement were reviewed in detail with the patient. Recommended Tdap at HD/PCP per CDC guidelines.  All questions were answered  Follow-up: Return in about 3 weeks (around 06/18/2017) for [place].  Orders Placed This Encounter  Procedures  . POCT urinalysis dipstick   Roma Schanz CNM, [facility] 05/28/2017 11:21 AM

## 2017-05-28 NOTE — Patient Instructions (Signed)
Betty Acosta, I greatly value your feedback.  If you receive a survey following your visit with [place] today, we appreciate you taking the time to fill it out.  Thanks, Knute Neu, CNM, WHNP-BC   Call the office [phone]) or go to [facility] if:  You begin to have strong, frequent contractions  Your water breaks.  Sometimes it is a big gush of fluid, sometimes it is just a trickle that keeps getting your panties wet or running down your legs  You have vaginal bleeding.  It is normal to have a small amount of spotting if your cervix was checked.   You don't feel your baby moving like normal.  If you don't, get you something to eat and drink and lay down and focus on feeling your baby move.  You should feel at least 10 movements in 2 hours.  If you don't, you should call the office or go to [facility].    Tdap Vaccine  It is recommended that you get the Tdap vaccine during the third trimester of EACH pregnancy to help protect your baby from getting pertussis (whooping cough)  27-36 weeks is the BEST time to do this so that you can pass the protection on to your baby. During pregnancy is better than after pregnancy, but if you are unable to get it during pregnancy it will be offered at the hospital.   You can get this vaccine at the health department or your family doctor  Everyone who will be around your baby should also be up-to-date on their vaccines. Adults (who are not pregnant) only need 1 dose of Tdap during adulthood.   Third Trimester of Pregnancy The third trimester is from week 29 through week 42, months 7 through 9. The third trimester is a time when the fetus is growing rapidly. At the end of the ninth month, the fetus is about 20 inches in length and weighs 6-10 pounds.  BODY CHANGES Your body goes through many changes during pregnancy. The changes vary from woman to woman.   Your weight will continue to increase. You can expect to gain 25-35 pounds (11-16 kg) by the  end of the pregnancy.  You may begin to get stretch marks on your hips, abdomen, and breasts.  You may urinate more often because the fetus is moving lower into your pelvis and pressing on your bladder.  You may develop or continue to have heartburn as a result of your pregnancy.  You may develop constipation because certain hormones are causing the muscles that push waste through your intestines to slow down.  You may develop hemorrhoids or swollen, bulging veins (varicose veins).  You may have pelvic pain because of the weight gain and pregnancy hormones relaxing your joints between the bones in your pelvis. Backaches may result from overexertion of the muscles supporting your posture.  You may have changes in your hair. These can include thickening of your hair, rapid growth, and changes in texture. Some women also have hair loss during or after pregnancy, or hair that feels dry or thin. Your hair will most likely return to normal after your baby is born.  Your breasts will continue to grow and be tender. A yellow discharge may leak from your breasts called colostrum.  Your belly button may stick out.  You may feel short of breath because of your expanding uterus.  You may notice the fetus "dropping," or moving lower in your abdomen.  You may have a bloody mucus  discharge. This usually occurs a few days to a week before labor begins.  Your cervix becomes thin and soft (effaced) near your due date. WHAT TO EXPECT AT YOUR PRENATAL EXAMS  You will have prenatal exams every 2 weeks until week 36. Then, you will have weekly prenatal exams. During a routine prenatal visit:  You will be weighed to make sure you and the fetus are growing normally.  Your blood pressure is taken.  Your abdomen will be measured to track your baby's growth.  The fetal heartbeat will be listened to.  Any test results from the previous visit will be discussed.  You may have a cervical check near your due  date to see if you have effaced. At around 36 weeks, your caregiver will check your cervix. At the same time, your caregiver will also perform a test on the secretions of the vaginal tissue. This test is to determine if a type of bacteria, Group B streptococcus, is present. Your caregiver will explain this further. Your caregiver may ask you:  What your birth plan is.  How you are feeling.  If you are feeling the baby move.  If you have had any abnormal symptoms, such as leaking fluid, bleeding, severe headaches, or abdominal cramping.  If you have any questions. Other tests or screenings that may be performed during your third trimester include:  Blood tests that check for low iron levels (anemia).  Fetal testing to check the health, activity level, and growth of the fetus. Testing is done if you have certain medical conditions or if there are problems during the pregnancy. FALSE LABOR You may feel small, irregular contractions that eventually go away. These are called Braxton Hicks contractions, or false labor. Contractions may last for hours, days, or even weeks before true labor sets in. If contractions come at regular intervals, intensify, or become painful, it is best to be seen by your caregiver.  SIGNS OF LABOR   Menstrual-like cramps.  Contractions that are 5 minutes apart or less.  Contractions that start on the top of the uterus and spread down to the lower abdomen and back.  A sense of increased pelvic pressure or back pain.  A watery or bloody mucus discharge that comes from the vagina. If you have any of these signs before the 37th week of pregnancy, call your caregiver right away. You need to go to the hospital to get checked immediately. HOME CARE INSTRUCTIONS   Avoid all smoking, herbs, alcohol, and unprescribed drugs. These chemicals affect the formation and growth of the baby.  Follow your caregiver's instructions regarding medicine use. There are medicines that  are either safe or unsafe to take during pregnancy.  Exercise only as directed by your caregiver. Experiencing uterine cramps is a good sign to stop exercising.  Continue to eat regular, healthy meals.  Wear a good support bra for breast tenderness.  Do not use hot tubs, steam rooms, or saunas.  Wear your seat belt at all times when driving.  Avoid raw meat, uncooked cheese, cat litter boxes, and soil used by cats. These carry germs that can cause birth defects in the baby.  Take your prenatal vitamins.  Try taking a stool softener (if your caregiver approves) if you develop constipation. Eat more high-fiber foods, such as fresh vegetables or fruit and whole grains. Drink plenty of fluids to keep your urine clear or pale yellow.  Take warm sitz baths to soothe any pain or discomfort caused by hemorrhoids. Use  hemorrhoid cream if your caregiver approves.  If you develop varicose veins, wear support hose. Elevate your feet for 15 minutes, 3-4 times a day. Limit salt in your diet.  Avoid heavy lifting, wear low heal shoes, and practice good posture.  Rest a lot with your legs elevated if you have leg cramps or low back pain.  Visit your dentist if you have not gone during your pregnancy. Use a soft toothbrush to brush your teeth and be gentle when you floss.  A sexual relationship may be continued unless your caregiver directs you otherwise.  Do not travel far distances unless it is absolutely necessary and only with the approval of your caregiver.  Take prenatal classes to understand, practice, and ask questions about the labor and delivery.  Make a trial run to the hospital.  Pack your hospital bag.  Prepare the baby's nursery.  Continue to go to all your prenatal visits as directed by your caregiver. SEEK MEDICAL CARE IF:  You are unsure if you are in labor or if your water has broken.  You have dizziness.  You have mild pelvic cramps, pelvic pressure, or nagging pain  in your abdominal area.  You have persistent nausea, vomiting, or diarrhea.  You have a bad smelling vaginal discharge.  You have pain with urination. SEEK IMMEDIATE MEDICAL CARE IF:   You have a fever.  You are leaking fluid from your vagina.  You have spotting or bleeding from your vagina.  You have severe abdominal cramping or pain.  You have rapid weight loss or gain.  You have shortness of breath with chest pain.  You notice sudden or extreme swelling of your face, hands, ankles, feet, or legs.  You have not felt your baby move in over an hour.  You have severe headaches that do not go away with medicine.  You have vision changes. Document Released: 03/04/2001 Document Revised: 03/15/2013 Document Reviewed: 05/11/2012 [facility] Patient Information 2015 [place], [place]. This information is not intended to replace advice given to you by your health care provider. Make sure you discuss any questions you have with your health care provider.

## 2017-06-18 ENCOUNTER — Encounter: Payer: Self-pay | Admitting: Advanced Practice Midwife

## 2017-06-18 ENCOUNTER — Other Ambulatory Visit: Payer: Self-pay

## 2017-06-18 ENCOUNTER — Ambulatory Visit (INDEPENDENT_AMBULATORY_CARE_PROVIDER_SITE_OTHER): Payer: 59 | Admitting: Advanced Practice Midwife

## 2017-06-18 VITALS — BP 112/60 | HR 82 | Wt 131.0 lb

## 2017-06-18 DIAGNOSIS — Z3403 Encounter for supervision of normal first pregnancy, third trimester: Secondary | ICD-10-CM

## 2017-06-18 DIAGNOSIS — Z331 Pregnant state, incidental: Secondary | ICD-10-CM

## 2017-06-18 DIAGNOSIS — Z3A32 32 weeks gestation of pregnancy: Secondary | ICD-10-CM

## 2017-06-18 DIAGNOSIS — Z1389 Encounter for screening for other disorder: Secondary | ICD-10-CM

## 2017-06-18 LAB — POCT URINALYSIS DIPSTICK
Blood, UA: NEGATIVE
GLUCOSE UA: NEGATIVE
KETONES UA: NEGATIVE
Leukocytes, UA: NEGATIVE
Nitrite, UA: NEGATIVE
Protein, UA: NEGATIVE

## 2017-06-18 NOTE — Patient Instructions (Signed)
Emerson Monte, I greatly value your feedback.  If you receive a survey following your visit with [place] today, we appreciate you taking the time to fill it out.  Thanks, Nigel Berthold, CNM   Call the office [phone]) or go to [facility] if:  You begin to have strong, frequent contractions  Your water breaks.  Sometimes it is a big gush of fluid, sometimes it is just a trickle that keeps getting your panties wet or running down your legs  You have vaginal bleeding.  It is normal to have a small amount of spotting if your cervix was checked.   You don't feel your baby moving like normal.  If you don't, get you something to eat and drink and lay down and focus on feeling your baby move.  You should feel at least 10 movements in 2 hours.  If you don't, you should call the office or go to [facility].    Tdap Vaccine  It is recommended that you get the Tdap vaccine during the third trimester of EACH pregnancy to help protect your baby from getting pertussis (whooping cough)  27-36 weeks is the BEST time to do this so that you can pass the protection on to your baby. During pregnancy is better than after pregnancy, but if you are unable to get it during pregnancy it will be offered at the hospital.   You can get this vaccine at the health department or your family doctor  Everyone who will be around your baby should also be up-to-date on their vaccines. Adults (who are not pregnant) only need 1 dose of Tdap during adulthood.   Third Trimester of Pregnancy The third trimester is from week 29 through week 42, months 7 through 9. The third trimester is a time when the fetus is growing rapidly. At the end of the ninth month, the fetus is about 20 inches in length and weighs 6-10 pounds.  BODY CHANGES Your body goes through many changes during pregnancy. The changes vary from woman to woman.   Your weight will continue to increase. You can expect to gain 25-35 pounds (11-16 kg) by the  end of the pregnancy.  You may begin to get stretch marks on your hips, abdomen, and breasts.  You may urinate more often because the fetus is moving lower into your pelvis and pressing on your bladder.  You may develop or continue to have heartburn as a result of your pregnancy.  You may develop constipation because certain hormones are causing the muscles that push waste through your intestines to slow down.  You may develop hemorrhoids or swollen, bulging veins (varicose veins).  You may have pelvic pain because of the weight gain and pregnancy hormones relaxing your joints between the bones in your pelvis. Backaches may result from overexertion of the muscles supporting your posture.  You may have changes in your hair. These can include thickening of your hair, rapid growth, and changes in texture. Some women also have hair loss during or after pregnancy, or hair that feels dry or thin. Your hair will most likely return to normal after your baby is born.  Your breasts will continue to grow and be tender. A yellow discharge may leak from your breasts called colostrum.  Your belly button may stick out.  You may feel short of breath because of your expanding uterus.  You may notice the fetus "dropping," or moving lower in your abdomen.  You may have a bloody mucus discharge.  This usually occurs a few days to a week before labor begins.  Your cervix becomes thin and soft (effaced) near your due date. WHAT TO EXPECT AT YOUR PRENATAL EXAMS  You will have prenatal exams every 2 weeks until week 36. Then, you will have weekly prenatal exams. During a routine prenatal visit:  You will be weighed to make sure you and the fetus are growing normally.  Your blood pressure is taken.  Your abdomen will be measured to track your baby's growth.  The fetal heartbeat will be listened to.  Any test results from the previous visit will be discussed.  You may have a cervical check near your due  date to see if you have effaced. At around 36 weeks, your caregiver will check your cervix. At the same time, your caregiver will also perform a test on the secretions of the vaginal tissue. This test is to determine if a type of bacteria, Group B streptococcus, is present. Your caregiver will explain this further. Your caregiver may ask you:  What your birth plan is.  How you are feeling.  If you are feeling the baby move.  If you have had any abnormal symptoms, such as leaking fluid, bleeding, severe headaches, or abdominal cramping.  If you have any questions. Other tests or screenings that may be performed during your third trimester include:  Blood tests that check for low iron levels (anemia).  Fetal testing to check the health, activity level, and growth of the fetus. Testing is done if you have certain medical conditions or if there are problems during the pregnancy. FALSE LABOR You may feel small, irregular contractions that eventually go away. These are called Braxton Hicks contractions, or false labor. Contractions may last for hours, days, or even weeks before true labor sets in. If contractions come at regular intervals, intensify, or become painful, it is best to be seen by your caregiver.  SIGNS OF LABOR   Menstrual-like cramps.  Contractions that are 5 minutes apart or less.  Contractions that start on the top of the uterus and spread down to the lower abdomen and back.  A sense of increased pelvic pressure or back pain.  A watery or bloody mucus discharge that comes from the vagina. If you have any of these signs before the 37th week of pregnancy, call your caregiver right away. You need to go to the hospital to get checked immediately. HOME CARE INSTRUCTIONS   Avoid all smoking, herbs, alcohol, and unprescribed drugs. These chemicals affect the formation and growth of the baby.  Follow your caregiver's instructions regarding medicine use. There are medicines that  are either safe or unsafe to take during pregnancy.  Exercise only as directed by your caregiver. Experiencing uterine cramps is a good sign to stop exercising.  Continue to eat regular, healthy meals.  Wear a good support bra for breast tenderness.  Do not use hot tubs, steam rooms, or saunas.  Wear your seat belt at all times when driving.  Avoid raw meat, uncooked cheese, cat litter boxes, and soil used by cats. These carry germs that can cause birth defects in the baby.  Take your prenatal vitamins.  Try taking a stool softener (if your caregiver approves) if you develop constipation. Eat more high-fiber foods, such as fresh vegetables or fruit and whole grains. Drink plenty of fluids to keep your urine clear or pale yellow.  Take warm sitz baths to soothe any pain or discomfort caused by hemorrhoids. Use hemorrhoid  cream if your caregiver approves.  If you develop varicose veins, wear support hose. Elevate your feet for 15 minutes, 3-4 times a day. Limit salt in your diet.  Avoid heavy lifting, wear low heal shoes, and practice good posture.  Rest a lot with your legs elevated if you have leg cramps or low back pain.  Visit your dentist if you have not gone during your pregnancy. Use a soft toothbrush to brush your teeth and be gentle when you floss.  A sexual relationship may be continued unless your caregiver directs you otherwise.  Do not travel far distances unless it is absolutely necessary and only with the approval of your caregiver.  Take prenatal classes to understand, practice, and ask questions about the labor and delivery.  Make a trial run to the hospital.  Pack your hospital bag.  Prepare the baby's nursery.  Continue to go to all your prenatal visits as directed by your caregiver. SEEK MEDICAL CARE IF:  You are unsure if you are in labor or if your water has broken.  You have dizziness.  You have mild pelvic cramps, pelvic pressure, or nagging pain  in your abdominal area.  You have persistent nausea, vomiting, or diarrhea.  You have a bad smelling vaginal discharge.  You have pain with urination. SEEK IMMEDIATE MEDICAL CARE IF:   You have a fever.  You are leaking fluid from your vagina.  You have spotting or bleeding from your vagina.  You have severe abdominal cramping or pain.  You have rapid weight loss or gain.  You have shortness of breath with chest pain.  You notice sudden or extreme swelling of your face, hands, ankles, feet, or legs.  You have not felt your baby move in over an hour.  You have severe headaches that do not go away with medicine.  You have vision changes. Document Released: 03/04/2001 Document Revised: 03/15/2013 Document Reviewed: 05/11/2012 [facility] Patient Information 2015 [place], [place]. This information is not intended to replace advice given to you by your health care provider. Make sure you discuss any questions you have with your health care provider.

## 2017-06-18 NOTE — Progress Notes (Signed)
  G1P0 [redacted]w[redacted]d Estimated Date of Delivery: 08/13/17  Blood pressure 112/60, pulse 82, weight 131 lb (59.4 kg), last menstrual period 11/06/2016.   BP weight and urine results all reviewed and noted.  Please refer to the obstetrical flow sheet for the fundal height and fetal heart rate documentation:  Patient reports good fetal movement, denies any bleeding and no rupture of membranes symptoms or regular contractions. Patient is without complaints. All questions were answered.   Physical Assessment:   Vitals:   06/18/17 1046  BP: 112/60  Pulse: 82  Weight: 131 lb (59.4 kg)  Body mass index is 23.96 kg/m.        Physical Examination:   General appearance: Well appearing, and in no distress  Mental status: Alert, oriented to person, place, and time  Skin: Warm & dry  Cardiovascular: Normal heart rate noted  Respiratory: Normal respiratory effort, no distress  Abdomen: Soft, gravid, nontender  Pelvic: +        Extremities: Edema: None  Fetal Status:     Movement: Present    Results for orders placed or performed in visit on 06/18/17 (from the past 24 hour(s))  POCT urinalysis dipstick   Collection Time: 06/18/17 10:47 AM  Result Value Ref Range   Color, UA     Clarity, UA     Glucose, UA neg    Bilirubin, UA     Ketones, UA neg    Spec Grav, UA  1.010 - 1.025   Blood, UA neg    pH, UA  5.0 - 8.0   Protein, UA neg    Urobilinogen, UA  0.2 or 1.0 E.U./dL   Nitrite, UA neg    Leukocytes, UA Negative Negative   Appearance     Odor       Orders Placed This Encounter  Procedures  . POCT urinalysis dipstick    Plan:  Continued routine obstetrical care,   Return in about 2 weeks (around 07/02/2017) for LROB.

## 2017-07-02 ENCOUNTER — Ambulatory Visit (INDEPENDENT_AMBULATORY_CARE_PROVIDER_SITE_OTHER): Payer: 59 | Admitting: Obstetrics and Gynecology

## 2017-07-02 VITALS — BP 104/60 | HR 72 | Wt 131.0 lb

## 2017-07-02 DIAGNOSIS — Z3A34 34 weeks gestation of pregnancy: Secondary | ICD-10-CM

## 2017-07-02 DIAGNOSIS — Z331 Pregnant state, incidental: Secondary | ICD-10-CM

## 2017-07-02 DIAGNOSIS — Z3403 Encounter for supervision of normal first pregnancy, third trimester: Secondary | ICD-10-CM

## 2017-07-02 DIAGNOSIS — Z1389 Encounter for screening for other disorder: Secondary | ICD-10-CM

## 2017-07-02 LAB — POCT URINALYSIS DIPSTICK
Blood, UA: NEGATIVE
GLUCOSE UA: NEGATIVE
Ketones, UA: NEGATIVE
Leukocytes, UA: NEGATIVE
Nitrite, UA: NEGATIVE
Protein, UA: NEGATIVE

## 2017-07-02 NOTE — Progress Notes (Addendum)
LOW-RISK PREGNANCY VISIT Patient name: Betty Acosta MRN [id number]  Date of birth: [birth year] Chief Complaint:   Routine Prenatal Visit  History of Present Illness:   Betty Acosta is a 32 y.o. G1P0 female at [redacted]w[redacted]d with an Estimated Date of Delivery: 08/13/17 being seen today for ongoing management of a low-risk pregnancy.  Today she reports no complaints. She will be going to childbirth classes ina few weeks with FOB. She notes a sharp shooting pain in her vaginal area whenever her baby moves which I consider normal Contractions: Not present. Vag. Bleeding: None.  Movement: Present. denies leaking of fluid. Review of Systems:   Pertinent items are noted in HPI Denies abnormal vaginal discharge w/ itching/odor/irritation, headaches, visual changes, shortness of breath, chest pain, abdominal pain, severe nausea/vomiting, or problems with urination or bowel movements unless otherwise stated above. Pertinent History Reviewed:  Reviewed past medical,surgical, social, obstetrical and family history.  Reviewed problem list, medications and allergies. Physical Assessment:   Vitals:   07/02/17 1039  BP: 104/60  Pulse: 72  Weight: 131 lb (59.4 kg)  Body mass index is 23.96 kg/m.        Physical Examination:   General appearance: Well appearing, and in no distress  Mental status: Alert, oriented to person, place, and time  Skin: Warm & dry  Cardiovascular: Normal heart rate noted  Respiratory: Normal respiratory effort, no distress  Abdomen: Soft, gravid, nontender, vertex by leopold's Fundal ht 32  Pelvic: Cervical exam deferred         Extremities: Edema: None  Fetal Status:     Movement: Present   FHT 146 vertex  Results for orders placed or performed in visit on 07/02/17 (from the past 24 hour(s))  POCT Urinalysis Dipstick   Collection Time: 07/02/17 10:41 AM  Result Value Ref Range   Color, UA     Clarity, UA     Glucose, UA neg    Bilirubin, UA     Ketones, UA neg    Spec Grav,  UA  1.010 - 1.025   Blood, UA neg    pH, UA  5.0 - 8.0   Protein, UA neg    Urobilinogen, UA  0.2 or 1.0 E.U./dL   Nitrite, UA neg    Leukocytes, UA Negative Negative   Appearance     Odor      Assessment & Plan:  1) Low-risk pregnancy G1P0 at [redacted]w[redacted]d with an Estimated Date of Delivery: 08/13/17    Meds: No orders of the defined types were placed in this encounter.  Labs/procedures today: none  Plan:  Continue routine obstetrical care   Reviewed: Preterm labor symptoms and general obstetric precautions including but not limited to vaginal bleeding, contractions, leaking of fluid and fetal movement were reviewed in detail with the patient.  All questions were answered  Follow-up: Return in about 2 weeks (around 07/16/2017), or if symptoms worsen or fail to improve, for LROB. 3 weeks for GBS  Orders Placed This Encounter  Procedures  . POCT Urinalysis Dipstick     By signing my name below, I, Izna Ahmed, attest that this documentation has been prepared under the direction and in the presence of Jonnie Kind, MD. Electronically Signed: Jabier Gauss, Medical Scribe. 07/02/17. 11:03 AM.  I personally performed the services described in this documentation, which was SCRIBED in my presence. The recorded information has been reviewed and considered accurate. It has been edited as necessary during review. Jonnie Kind, MD

## 2017-07-16 ENCOUNTER — Ambulatory Visit (INDEPENDENT_AMBULATORY_CARE_PROVIDER_SITE_OTHER): Payer: 59 | Admitting: Advanced Practice Midwife

## 2017-07-16 ENCOUNTER — Encounter: Payer: Self-pay | Admitting: Advanced Practice Midwife

## 2017-07-16 ENCOUNTER — Other Ambulatory Visit: Payer: Self-pay

## 2017-07-16 VITALS — BP 90/54 | HR 71 | Wt 135.0 lb

## 2017-07-16 DIAGNOSIS — Z1389 Encounter for screening for other disorder: Secondary | ICD-10-CM

## 2017-07-16 DIAGNOSIS — Z331 Pregnant state, incidental: Secondary | ICD-10-CM

## 2017-07-16 DIAGNOSIS — Z3403 Encounter for supervision of normal first pregnancy, third trimester: Secondary | ICD-10-CM

## 2017-07-16 DIAGNOSIS — Z3A36 36 weeks gestation of pregnancy: Secondary | ICD-10-CM

## 2017-07-16 LAB — POCT URINALYSIS DIPSTICK
Blood, UA: NEGATIVE
Glucose, UA: NEGATIVE
KETONES UA: NEGATIVE
Leukocytes, UA: NEGATIVE
Nitrite, UA: NEGATIVE
Protein, UA: NEGATIVE

## 2017-07-16 NOTE — Progress Notes (Signed)
  G1P0 [redacted]w[redacted]d Estimated Date of Delivery: 08/13/17  Blood pressure (!) 90/54, pulse 71, weight 135 lb (61.2 kg), last menstrual period 11/06/2016.   BP weight and urine results all reviewed and noted.  Please refer to the obstetrical flow sheet for the fundal height and fetal heart rate documentation:  Patient reports good fetal movement, denies any bleeding and no rupture of membranes symptoms or regular contractions. Patient is without complaints. All questions were answered.   Physical Assessment:   Vitals:   07/16/17 1123  BP: (!) 90/54  Pulse: 71  Weight: 135 lb (61.2 kg)  Body mass index is 24.69 kg/m.        Physical Examination:   General appearance: Well appearing, and in no distress  Mental status: Alert, oriented to person, place, and time  Skin: Warm & dry  Cardiovascular: Normal heart rate noted  Respiratory: Normal respiratory effort, no distress  Abdomen: Soft, gravid, nontender  Pelvic: Cervical exam deferred         Extremities: Edema: None  Fetal Status: Fetal Heart Rate (bpm): 147 Fundal Height: 34 cm Movement: Present    Results for orders placed or performed in visit on 07/16/17 (from the past 24 hour(s))  POCT urinalysis dipstick   Collection Time: 07/16/17 11:24 AM  Result Value Ref Range   Color, UA     Clarity, UA     Glucose, UA neg    Bilirubin, UA     Ketones, UA neg    Spec Grav, UA  1.010 - 1.025   Blood, UA neg    pH, UA  5.0 - 8.0   Protein, UA neg    Urobilinogen, UA  0.2 or 1.0 E.U./dL   Nitrite, UA neg    Leukocytes, UA Negative Negative   Appearance     Odor       Orders Placed This Encounter  Procedures  . POCT urinalysis dipstick    Plan:  Continued routine obstetrical care,   Return in about 1 week (around 07/23/2017) for LROB.

## 2017-07-16 NOTE — Patient Instructions (Signed)
Cervical Ripening: May try one or both  Red Raspberry Leaf capsules: two 300mg or 400mg tablets with each meal, 2-3 times a day  Potential Side Effects Of Raspberry Leaf:  Most women do not experience any side effects from drinking raspberry leaf tea. However, nausea and loose stools are possible   Evening Primrose Oil capsules: 3 500 mg capsules daily.  Some of the potential side effects:  Upset stomach  Loose stools or diarrhea  Headaches  Nausea:     

## 2017-07-23 ENCOUNTER — Ambulatory Visit (INDEPENDENT_AMBULATORY_CARE_PROVIDER_SITE_OTHER): Payer: 59 | Admitting: Women's Health

## 2017-07-23 ENCOUNTER — Encounter: Payer: Self-pay | Admitting: Women's Health

## 2017-07-23 ENCOUNTER — Other Ambulatory Visit: Payer: Self-pay

## 2017-07-23 VITALS — BP 100/58 | HR 85 | Wt 134.0 lb

## 2017-07-23 DIAGNOSIS — Z331 Pregnant state, incidental: Secondary | ICD-10-CM

## 2017-07-23 DIAGNOSIS — Z3403 Encounter for supervision of normal first pregnancy, third trimester: Secondary | ICD-10-CM

## 2017-07-23 DIAGNOSIS — Z3A37 37 weeks gestation of pregnancy: Secondary | ICD-10-CM

## 2017-07-23 DIAGNOSIS — Z1389 Encounter for screening for other disorder: Secondary | ICD-10-CM

## 2017-07-23 LAB — POCT URINALYSIS DIPSTICK
Glucose, UA: NEGATIVE
Ketones, UA: NEGATIVE
LEUKOCYTES UA: NEGATIVE
Nitrite, UA: NEGATIVE
Protein, UA: NEGATIVE
RBC UA: NEGATIVE

## 2017-07-23 NOTE — Progress Notes (Signed)
   LOW-RISK PREGNANCY VISIT Patient name: Betty Acosta MRN [id number]  Date of birth: March 26, 1985 Chief Complaint:   Routine Prenatal Visit (gbs/gc today)  History of Present Illness:   Betty Acosta is a 32 y.o. G1P0 female at [redacted]w[redacted]d with an Estimated Date of Delivery: 08/13/17 being seen today for ongoing management of a low-risk pregnancy.  Today she reports had small amt leaking the other day, none since. Contractions: Irregular. Vag. Bleeding: None.  Movement: Present. reports leaking of fluid. Review of Systems:   Pertinent items are noted in HPI Denies abnormal vaginal discharge w/ itching/odor/irritation, headaches, visual changes, shortness of breath, chest pain, abdominal pain, severe nausea/vomiting, or problems with urination or bowel movements unless otherwise stated above. Pertinent History Reviewed:  Reviewed past medical,surgical, social, obstetrical and family history.  Reviewed problem list, medications and allergies. Physical Assessment:   Vitals:   07/23/17 1102  BP: (!) 100/58  Pulse: 85  Weight: 134 lb (60.8 kg)  Body mass index is 24.51 kg/m.        Physical Examination:   General appearance: Well appearing, and in no distress  Mental status: Alert, oriented to person, place, and time  Skin: Warm & dry  Cardiovascular: Normal heart rate noted  Respiratory: Normal respiratory effort, no distress  Abdomen: Soft, gravid, nontender  Pelvic: SSE cx visually closed, thin white nonodorous d/c, no pooling, no change w/ valsalva  Dilation: 2.5 Effacement (%): 80 Station: -2  Extremities: Edema: None  Fetal Status: Fetal Heart Rate (bpm): 142 Fundal Height: 36 cm Movement: Present Presentation: Vertex  Results for orders placed or performed in visit on 07/23/17 (from the past 24 hour(s))  POCT urinalysis dipstick   Collection Time: 07/23/17 11:02 AM  Result Value Ref Range   Color, UA     Clarity, UA     Glucose, UA neg    Bilirubin, UA     Ketones, UA neg    Spec  Grav, UA  1.010 - 1.025   Blood, UA neg    pH, UA  5.0 - 8.0   Protein, UA neg    Urobilinogen, UA  0.2 or 1.0 E.U./dL   Nitrite, UA neg    Leukocytes, UA Negative Negative   Appearance     Odor      Assessment & Plan:  1) Low-risk pregnancy G1P0 at [redacted]w[redacted]d with an Estimated Date of Delivery: 08/13/17   2) Thin vaginal d/c, no evidence of ROM, discussed ROM s/s   Meds: No orders of the defined types were placed in this encounter.  Labs/procedures today: gbs, gc/ct, sve  Plan:  Continue routine obstetrical care   Reviewed: Term labor symptoms and general obstetric precautions including but not limited to vaginal bleeding, contractions, leaking of fluid and fetal movement were reviewed in detail with the patient.  All questions were answered  Follow-up: Return in about 1 week (around 07/30/2017) for LROB.  Orders Placed This Encounter  Procedures  . GC/Chlamydia Probe Amp  . Culture, beta strep (group b only)  . POCT urinalysis dipstick   Roma Schanz CNM, [facility] 07/23/2017 11:23 AM

## 2017-07-23 NOTE — Patient Instructions (Signed)
Betty Acosta, I greatly value your feedback.  If you receive a survey following your visit with [place] today, we appreciate you taking the time to fill it out.  Thanks, Knute Neu, CNM, WHNP-BC   Call the office [phone]) or go to [facility] if:  You begin to have strong, frequent contractions  Your water breaks.  Sometimes it is a big gush of fluid, sometimes it is just a trickle that keeps getting your panties wet or running down your legs  You have vaginal bleeding.  It is normal to have a small amount of spotting if your cervix was checked.   You don't feel your baby moving like normal.  If you don't, get you something to eat and drink and lay down and focus on feeling your baby move.  You should feel at least 10 movements in 2 hours.  If you don't, you should call the office or go to [place] Contractions Contractions of the uterus can occur throughout pregnancy, but they are not always a sign that you are in labor. You may have practice contractions called Braxton Hicks contractions. These false labor contractions are sometimes confused with true labor. What are [place] contractions? Braxton Hicks contractions are tightening movements that occur in the muscles of the uterus before labor. Unlike true labor contractions, these contractions do not result in opening (dilation) and thinning of the cervix. Toward the end of pregnancy (32-34 weeks), Braxton Hicks contractions can happen more often and may become stronger. These contractions are sometimes difficult to tell apart from true labor because they can be very uncomfortable. You should not feel embarrassed if you go to the hospital with false labor. Sometimes, the only way to tell if you are in true labor is for your health care provider to look for changes in the cervix. The health care provider will do a physical exam and may monitor your contractions. If you are not in true labor, the exam should show  that your cervix is not dilating and your water has not broken. If there are other health problems associated with your pregnancy, it is completely safe for you to be sent home with false labor. You may continue to have Braxton Hicks contractions until you go into true labor. How to tell the difference between true labor and false labor True labor  Contractions last 30-70 seconds.  Contractions become very regular.  Discomfort is usually felt in the top of the uterus, and it spreads to the lower abdomen and low back.  Contractions do not go away with walking.  Contractions usually become more intense and increase in frequency.  The cervix dilates and gets thinner. False labor  Contractions are usually shorter and not as strong as true labor contractions.  Contractions are usually irregular.  Contractions are often felt in the front of the lower abdomen and in the groin.  Contractions may go away when you walk around or change positions while lying down.  Contractions get weaker and are shorter-lasting as time goes on.  The cervix usually does not dilate or become thin. Follow these instructions at home:  Take over-the-counter and prescription medicines only as told by your health care provider.  Keep up with your usual exercises and follow other instructions from your health care provider.  Eat and drink lightly if you think you are going into labor.  If Braxton Hicks contractions are making you uncomfortable: ? Change your position from lying down  or resting to walking, or change from walking to resting. ? Sit and rest in a tub of warm water. ? Drink enough fluid to keep your urine pale yellow. Dehydration may cause these contractions. ? Do slow and deep breathing several times an hour.  Keep all follow-up prenatal visits as told by your health care provider. This is important. Contact a health care provider if:  You have a fever.  You have continuous pain in your  abdomen. Get help right away if:  Your contractions become stronger, more regular, and closer together.  You have fluid leaking or gushing from your vagina.  You pass blood-tinged mucus (bloody show).  You have bleeding from your vagina.  You have low back pain that you never had before.  You feel your baby's head pushing down and causing pelvic pressure.  Your baby is not moving inside you as much as it used to. Summary  Contractions that occur before labor are called Braxton Hicks contractions, false labor, or practice contractions.  Braxton Hicks contractions are usually shorter, weaker, farther apart, and less regular than true labor contractions. True labor contractions usually become progressively stronger and regular and they become more frequent.  Manage discomfort from [facility] contractions by changing position, resting in a warm bath, drinking plenty of water, or practicing deep breathing. This information is not intended to replace advice given to you by your health care provider. Make sure you discuss any questions you have with your health care provider. Document Released: 07/24/2016 Document Revised: 07/24/2016 Document Reviewed: 07/24/2016 Elsevier Interactive Patient Education  2018 [employer].

## 2017-07-26 LAB — GC/CHLAMYDIA PROBE AMP
Chlamydia trachomatis, NAA: NEGATIVE
Neisseria gonorrhoeae by PCR: NEGATIVE

## 2017-07-27 LAB — CULTURE, BETA STREP (GROUP B ONLY): Strep Gp B Culture: NEGATIVE

## 2017-07-28 ENCOUNTER — Inpatient Hospital Stay (HOSPITAL_COMMUNITY): Payer: 59 | Admitting: Anesthesiology

## 2017-07-28 ENCOUNTER — Inpatient Hospital Stay (HOSPITAL_COMMUNITY)
Admission: AD | Admit: 2017-07-28 | Discharge: 2017-07-30 | DRG: 805 | Disposition: A | Discharge status: Home / Self Care | Payer: 59 | Source: Ambulatory Visit | Attending: Family Medicine | Admitting: Family Medicine

## 2017-07-28 ENCOUNTER — Encounter (HOSPITAL_COMMUNITY): Payer: Self-pay | Admitting: *Deleted

## 2017-07-28 ENCOUNTER — Telehealth: Payer: Self-pay | Admitting: Obstetrics & Gynecology

## 2017-07-28 DIAGNOSIS — O43123 Velamentous insertion of umbilical cord, third trimester: Principal | ICD-10-CM | POA: Diagnosis present

## 2017-07-28 DIAGNOSIS — O411230 Chorioamnionitis, third trimester, not applicable or unspecified: Secondary | ICD-10-CM | POA: Diagnosis present

## 2017-07-28 DIAGNOSIS — Z3483 Encounter for supervision of other normal pregnancy, third trimester: Secondary | ICD-10-CM | POA: Diagnosis present

## 2017-07-28 DIAGNOSIS — D649 Anemia, unspecified: Secondary | ICD-10-CM | POA: Diagnosis present

## 2017-07-28 DIAGNOSIS — O9902 Anemia complicating childbirth: Secondary | ICD-10-CM | POA: Diagnosis present

## 2017-07-28 DIAGNOSIS — Z3A37 37 weeks gestation of pregnancy: Secondary | ICD-10-CM

## 2017-07-28 DIAGNOSIS — Z3A38 38 weeks gestation of pregnancy: Secondary | ICD-10-CM | POA: Diagnosis not present

## 2017-07-28 LAB — TYPE AND SCREEN
ABO/RH(D): O POS
Antibody Screen: NEGATIVE

## 2017-07-28 LAB — CBC
HEMATOCRIT: 33 % — AB (ref 36.0–46.0)
HEMOGLOBIN: 11.2 g/dL — AB (ref 12.0–15.0)
MCH: 30.1 pg (ref 26.0–34.0)
MCHC: 33.9 g/dL (ref 30.0–36.0)
MCV: 88.7 fL (ref 78.0–100.0)
Platelets: 215 10*3/uL (ref 150–400)
RBC: 3.72 MIL/uL — ABNORMAL LOW (ref 3.87–5.11)
RDW: 13.3 % (ref 11.5–15.5)
WBC: 12.1 10*3/uL — ABNORMAL HIGH (ref 4.0–10.5)

## 2017-07-28 MED ORDER — LACTATED RINGERS IV SOLN
INTRAVENOUS | Status: DC
  Administered 2017-07-28: 22:00:00 via INTRAVENOUS

## 2017-07-28 MED ORDER — OXYCODONE-ACETAMINOPHEN 5-325 MG PO TABS: 1.0000 | ORAL_TABLET | ORAL | Status: DC | PRN

## 2017-07-28 MED ORDER — HYDROXYZINE HCL 50 MG PO TABS
50.0000 mg | ORAL_TABLET | Freq: Four times a day (QID) | ORAL | Status: DC | PRN
  Filled 2017-07-28: qty 1

## 2017-07-28 MED ORDER — FENTANYL CITRATE (PF) 100 MCG/2ML IJ SOLN: 50.0000 ug | INTRAMUSCULAR | Status: DC | PRN

## 2017-07-28 MED ORDER — FENTANYL 2.5 MCG/ML BUPIVACAINE 1/10 % EPIDURAL INFUSION (WH - ANES)
14.0000 mL/h | INTRAMUSCULAR | Status: DC | PRN
  Administered 2017-07-28: 14 mL/h via EPIDURAL
  Filled 2017-07-28: qty 100

## 2017-07-28 MED ORDER — ONDANSETRON HCL 4 MG/2ML IJ SOLN
4.0000 mg | Freq: Four times a day (QID) | INTRAMUSCULAR | Status: DC | PRN
  Administered 2017-07-29: 4 mg via INTRAVENOUS
  Filled 2017-07-28 (×2): qty 2

## 2017-07-28 MED ORDER — LACTATED RINGERS IV SOLN: 500.0000 mL | INTRAVENOUS | Status: DC | PRN

## 2017-07-28 MED ORDER — LIDOCAINE HCL (PF) 1 % IJ SOLN
INTRAMUSCULAR | Status: DC | PRN
  Administered 2017-07-28 (×2): 5 mL via EPIDURAL

## 2017-07-28 MED ORDER — ACETAMINOPHEN 325 MG PO TABS
650.0000 mg | ORAL_TABLET | ORAL | Status: DC | PRN
  Administered 2017-07-29: 650 mg via ORAL
  Filled 2017-07-28: qty 2

## 2017-07-28 MED ORDER — PHENYLEPHRINE 40 MCG/ML (10ML) SYRINGE FOR IV PUSH (FOR BLOOD PRESSURE SUPPORT)
80.0000 ug | PREFILLED_SYRINGE | INTRAVENOUS | Status: DC | PRN
  Filled 2017-07-28: qty 10
  Filled 2017-07-28: qty 5

## 2017-07-28 MED ORDER — LIDOCAINE HCL (PF) 1 % IJ SOLN
30.0000 mL | INTRAMUSCULAR | Status: DC | PRN
  Filled 2017-07-28: qty 30

## 2017-07-28 MED ORDER — EPHEDRINE 5 MG/ML INJ
10.0000 mg | INTRAVENOUS | Status: DC | PRN
  Filled 2017-07-28: qty 2

## 2017-07-28 MED ORDER — OXYCODONE-ACETAMINOPHEN 5-325 MG PO TABS: 2.0000 | ORAL_TABLET | ORAL | Status: DC | PRN

## 2017-07-28 MED ORDER — LACTATED RINGERS IV SOLN: 500.0000 mL | Freq: Once | INTRAVENOUS | Status: DC

## 2017-07-28 MED ORDER — DIPHENHYDRAMINE HCL 50 MG/ML IJ SOLN: 12.5000 mg | INTRAMUSCULAR | Status: DC | PRN

## 2017-07-28 MED ORDER — SOD CITRATE-CITRIC ACID 500-334 MG/5ML PO SOLN: 30.0000 mL | ORAL | Status: DC | PRN

## 2017-07-28 MED ORDER — OXYTOCIN BOLUS FROM INFUSION
500.0000 mL | Freq: Once | INTRAVENOUS | Status: AC
  Administered 2017-07-29: 500 mL via INTRAVENOUS

## 2017-07-28 MED ORDER — PHENYLEPHRINE 40 MCG/ML (10ML) SYRINGE FOR IV PUSH (FOR BLOOD PRESSURE SUPPORT)
80.0000 ug | PREFILLED_SYRINGE | INTRAVENOUS | Status: DC | PRN
  Filled 2017-07-28: qty 5

## 2017-07-28 MED ORDER — FLEET ENEMA 7-19 GM/118ML RE ENEM: 1.0000 | ENEMA | RECTAL | Status: DC | PRN

## 2017-07-28 MED ORDER — OXYTOCIN 40 UNITS IN LACTATED RINGERS INFUSION - SIMPLE MED
2.5000 [IU]/h | INTRAVENOUS | Status: DC
  Filled 2017-07-28: qty 1000

## 2017-07-28 NOTE — Anesthesia Preprocedure Evaluation (Signed)
Anesthesia Evaluation  Patient identified by MRN, date of birth, ID band Patient awake    Reviewed: Allergy & Precautions, H&P , NPO status , Patient's Chart, lab work & pertinent test results  History of Anesthesia Complications Negative for: history of anesthetic complications  Airway Mallampati: II  TM Distance: >3 FB Neck ROM: full    Dental no notable dental hx. (+) Teeth Intact   Pulmonary asthma ,    Pulmonary exam normal breath sounds clear to auscultation       Cardiovascular negative cardio ROS Normal cardiovascular exam Rhythm:regular Rate:Normal     Neuro/Psych Anxiety negative neurological ROS     GI/Hepatic Neg liver ROS, IBS (irritable bowel syndrome)   Endo/Other  negative endocrine ROS  Renal/GU negative Renal ROS  negative genitourinary   Musculoskeletal   Abdominal   Peds  Hematology  (+) anemia ,   Anesthesia Other Findings   Reproductive/Obstetrics (+) Pregnancy                             Anesthesia Physical Anesthesia Plan  ASA: II  Anesthesia Plan: Epidural   Post-op Pain Management:    Induction:   PONV Risk Score and Plan:   Airway Management Planned:   Additional Equipment:   Intra-op Plan:   Post-operative Plan:   Informed Consent: I have reviewed the patients History and Physical, chart, labs and discussed the procedure including the risks, benefits and alternatives for the proposed anesthesia with the patient or authorized representative who has indicated his/her understanding and acceptance.     Plan Discussed with:   Anesthesia Plan Comments:         Anesthesia Quick Evaluation

## 2017-07-28 NOTE — MAU Note (Signed)
Contractions since 1700

## 2017-07-28 NOTE — Progress Notes (Signed)
   07/28/17 2015  Fetal Heart Rate A  Baseline Rate (A) 170 bpm  Variability 6-25 BPM;<5 BPM  Accelerations 15 x 15  Decelerations None  Uterine Activity  Mode Toco  Contraction Frequency (min) 2-3  Contraction Duration (sec) 60-100  Contraction Quality Moderate;Mild  Resting Tone Palpated Relaxed  Resting Time Adequate  Provider Notification  Provider Name/Title Knute Neu, cnm  Method of Notification Phone  Notification Reason Fetal heart rate change;Membrane status;Uterine activity;Vaginal exam  2015 - Pt here for labor check. VE - 4/90/-2. Baseline FHR 170-180. Knute Neu, cnm notified. CNM instructed for pt to PO hydrate. Pitcher of water given to pt and educated. Will continue to assess.

## 2017-07-28 NOTE — Telephone Encounter (Signed)
Patient states she had a little "snotty discharge" today also with a little spotting.  She is not having any contractions.  Informed patient the snotty discharge sounded like her mucous plug which can be lost several weeks before the baby is born.  Advised the discharge could be checked at her appointment tomorrow.  Pt verbalized understanding.

## 2017-07-28 NOTE — Anesthesia Procedure Notes (Signed)
Epidural Patient location during procedure: OB Start time: 07/28/2017 9:53 PM End time: 07/28/2017 10:03 PM  Staffing Anesthesiologist: Murvin Natal, MD Performed: anesthesiologist   Preanesthetic Checklist Completed: patient identified, site marked, pre-op evaluation, timeout performed, IV checked, risks and benefits discussed and monitors and equipment checked  Epidural Patient position: sitting Prep: DuraPrep Patient monitoring: heart rate, cardiac monitor, continuous pulse ox and blood pressure Approach: midline Location: L4-L5 Injection technique: LOR air  Needle:  Needle type: Tuohy  Needle gauge: 17 G Needle length: 9 cm Needle insertion depth: 5 cm Catheter type: closed end flexible Catheter size: 19 Gauge Catheter at skin depth: 10 cm Test dose: negative and Other  Assessment Events: blood not aspirated, injection not painful, no injection resistance and negative IV test  Additional Notes Informed consent obtained prior to proceeding including risk of failure, 1% risk of PDPH, risk of minor discomfort and bruising. Discussed alternatives to epidural analgesia and patient desires to proceed.  Timeout performed pre-procedure verifying patient name, procedure, and platelet count.  Patient tolerated procedure well. Reason for block:procedure for pain

## 2017-07-28 NOTE — H&P (Signed)
Betty Acosta is a 32 y.o. G1P0 female at [redacted]w[redacted]d by LMP c/w 7wk u/s, presenting in active labor.   Reports active fetal movement, contractions: regular since 1700, vaginal bleeding: none, membranes: intact. Initiated prenatal care at [facility] at 8 wks.    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 Anxiety   . Asthma   . IBS (irritable bowel syndrome)   . Vaginal Pap smear, abnormal     Past Surgical History: Past Surgical History:  Procedure Laterality Date  . LEEP      Obstetrical History: OB History    Gravida  1   Para      Term      Preterm      AB      Living        SAB      TAB      Ectopic      Multiple      Live Births              Social History: Social History   Socioeconomic History  . Marital status: Married    Spouse name: Not on file  . Number of children: Not on file  . Years of education: Not on file  . Highest education level: Not on file  Occupational History  . Not on file  Social Needs  . Financial resource strain: Not on file  . Food insecurity:    Worry: Not on file    Inability: Not on file  . Transportation needs:    Medical: Not on file    Non-medical: Not on file  Tobacco Use  . Smoking status: Never Smoker  . Smokeless tobacco: Never Used  Substance and Sexual Activity  . Alcohol use: No    Alcohol/week: 0.0 oz  . Drug use: No  . Sexual activity: Yes    Birth control/protection: None  Lifestyle  . Physical activity:    Days per week: Not on file    Minutes per session: Not on file  . Stress: Not on file  Relationships  . Social connections:    Talks on phone: Not on file    Gets together: Not on file    Attends religious service: Not on file    Active member of club or organization: Not on file    Attends meetings of clubs or organizations: Not on file    Relationship status: Not on  file  Other Topics Concern  . Not on file  Social History Narrative  . Not on file    Family History: Family History  Problem Relation Age of Onset  . Diabetes Maternal Aunt   . Hypertension Maternal Aunt   . Diabetes Maternal Grandmother   . Drug abuse Maternal Grandmother   . Hypertension Maternal Grandmother   . Congestive Heart Failure Mother   . Hypertension Mother   . Fibromyalgia Mother   . Other Mother        heart blockages  . Depression Mother   . Anxiety disorder Mother   . Hypertension Sister   . Other Sister        broderline diabetes  . ADD / ADHD Brother   . Autism Brother   . Heart attack Maternal Grandfather   . Cancer Paternal Grandmother   . ADD / ADHD Brother     Allergies: No Known Allergies  Medications Prior to  Admission  Medication Sig Dispense Refill Last Dose  . Doxylamine-Pyridoxine 10-10 MG TBEC 2 PO qhs; may take 1po in am and 1po in afternoon prn nausea (Patient not taking: Reported on 01/29/2017) 120 tablet 3 Not Taking  . Prenatal Vit-Fe Fumarate-FA (MULTIVITAMIN-PRENATAL) 27-0.8 MG TABS tablet Take 1 tablet by mouth daily at 12 noon.   Taking  . promethazine (PHENERGAN) 25 MG tablet Take 1 tablet (25 mg total) by mouth every 6 (six) hours as needed for nausea or vomiting. (Patient not taking: Reported on 01/29/2017) 30 tablet 1 Not Taking    Review of Systems  Pertinent pos/neg as indicated in HPI  Blood pressure 129/79, pulse 87, temperature 98.9 F (37.2 C), temperature source Tympanic, resp. rate 17, height 5' 1.5" (1.562 m), weight 61.5 kg (135 lb 8 oz), last menstrual period 11/06/2016. General appearance: alert, cooperative and mild distress Lungs: clear to auscultation bilaterally Heart: regular rate and rhythm Abdomen: gravid, soft, non-tender Extremities: trace edema DTR's 2+  Fetal monitoring: FHR: 155 bpm, variability: moderate,  Accelerations: Present,  decelerations:  Absent Uterine activity: q 64mins Dilation:  5.5 Effacement (%): 100 Station: -2 Exam by:: Glenice Bow, rnc  Presentation: cephalic   Prenatal labs: ABO, Rh: O/Positive/-- (10/17 1454) Antibody: Negative (02/14 0839) Rubella: 5.02 (10/17 1454) RPR: Non Reactive (02/14 0839)  HBsAg: Negative (10/17 1454)  HIV: Non Reactive (02/14 0839)  GBS:     2hr GTT: 74/122/107 Genetic screening:  neg Anatomy US: normal  Results for orders placed or performed during the hospital encounter of 07/28/17 (from the past 24 hour(s))  CBC   Collection Time: 07/28/17  9:00 PM  Result Value Ref Range   WBC 12.1 (H) 4.0 - 10.5 K/uL   RBC 3.72 (L) 3.87 - 5.11 MIL/uL   Hemoglobin 11.2 (L) 12.0 - 15.0 g/dL   HCT 33.0 (L) 36.0 - 46.0 %   MCV 88.7 78.0 - 100.0 fL   MCH 30.1 26.0 - 34.0 pg   MCHC 33.9 30.0 - 36.0 g/dL   RDW 13.3 11.5 - 15.5 %   Platelets 215 150 - 400 K/uL     Assessment:  [redacted]w[redacted]d SIUP  G1P0  Active labor  Cat 1 FHR  GBS  neg  Plan:  Admit to [facility]  IV pain meds/epidural prn active labor  Expectant management  Anticipate NSVB   Plans to breastfeed  Contraception: undecided  Circumcision: yes  Roma Schanz CNM, WHNP-BC 07/28/2017, 9:37 PM

## 2017-07-29 ENCOUNTER — Other Ambulatory Visit: Payer: Self-pay

## 2017-07-29 ENCOUNTER — Telehealth: Payer: Self-pay | Admitting: *Deleted

## 2017-07-29 ENCOUNTER — Encounter: Payer: 59 | Admitting: Advanced Practice Midwife

## 2017-07-29 ENCOUNTER — Encounter (HOSPITAL_COMMUNITY): Payer: Self-pay | Admitting: *Deleted

## 2017-07-29 DIAGNOSIS — Z3A38 38 weeks gestation of pregnancy: Secondary | ICD-10-CM

## 2017-07-29 LAB — ABO/RH: ABO/RH(D): O POS

## 2017-07-29 LAB — RPR: RPR Ser Ql: NONREACTIVE

## 2017-07-29 MED ORDER — IBUPROFEN 600 MG PO TABS
600.0000 mg | ORAL_TABLET | Freq: Four times a day (QID) | ORAL | Status: DC
  Administered 2017-07-29 – 2017-07-30 (×6): 600 mg via ORAL
  Filled 2017-07-29 (×5): qty 1

## 2017-07-29 MED ORDER — GENTAMICIN SULFATE 40 MG/ML IJ SOLN
130.0000 mg | Freq: Three times a day (TID) | INTRAVENOUS | Status: DC
  Filled 2017-07-29: qty 3.25

## 2017-07-29 MED ORDER — COCONUT OIL OIL
1.0000 "application " | TOPICAL_OIL | Status: DC | PRN
  Administered 2017-07-29: 1 via TOPICAL
  Filled 2017-07-29: qty 120

## 2017-07-29 MED ORDER — SODIUM CHLORIDE 0.9% FLUSH: 3.0000 mL | INTRAVENOUS | Status: DC | PRN

## 2017-07-29 MED ORDER — ONDANSETRON HCL 4 MG/2ML IJ SOLN: 4.0000 mg | INTRAMUSCULAR | Status: DC | PRN

## 2017-07-29 MED ORDER — DIPHENHYDRAMINE HCL 25 MG PO CAPS
25.0000 mg | ORAL_CAPSULE | Freq: Four times a day (QID) | ORAL | Status: DC | PRN
Start: 2017-07-29 — End: 2017-07-30

## 2017-07-29 MED ORDER — TETANUS-DIPHTH-ACELL PERTUSSIS 5-2.5-18.5 LF-MCG/0.5 IM SUSP
0.5000 mL | Freq: Once | INTRAMUSCULAR | Status: AC
  Administered 2017-07-30: 0.5 mL via INTRAMUSCULAR

## 2017-07-29 MED ORDER — DIBUCAINE 1 % RE OINT: 1.0000 "application " | TOPICAL_OINTMENT | RECTAL | Status: DC | PRN

## 2017-07-29 MED ORDER — ACETAMINOPHEN 500 MG PO TABS: 1000.0000 mg | ORAL_TABLET | Freq: Once | ORAL | Status: DC

## 2017-07-29 MED ORDER — MEASLES, MUMPS & RUBELLA VAC ~~LOC~~ INJ
0.5000 mL | INJECTION | Freq: Once | SUBCUTANEOUS | Status: DC
  Filled 2017-07-29: qty 0.5

## 2017-07-29 MED ORDER — ONDANSETRON HCL 4 MG PO TABS
4.0000 mg | ORAL_TABLET | ORAL | Status: DC | PRN
Start: 2017-07-29 — End: 2017-07-30

## 2017-07-29 MED ORDER — PRENATAL MULTIVITAMIN CH
1.0000 | ORAL_TABLET | Freq: Every day | ORAL | Status: DC
  Administered 2017-07-29 – 2017-07-30 (×2): 1 via ORAL
  Filled 2017-07-29 (×2): qty 1

## 2017-07-29 MED ORDER — ACETAMINOPHEN 325 MG PO TABS: 650.0000 mg | ORAL_TABLET | ORAL | Status: DC | PRN

## 2017-07-29 MED ORDER — SIMETHICONE 80 MG PO CHEW
80.0000 mg | CHEWABLE_TABLET | ORAL | Status: DC | PRN
Start: 2017-07-29 — End: 2017-07-30

## 2017-07-29 MED ORDER — ZOLPIDEM TARTRATE 5 MG PO TABS: 5.0000 mg | ORAL_TABLET | Freq: Every evening | ORAL | Status: DC | PRN

## 2017-07-29 MED ORDER — GENTAMICIN SULFATE 40 MG/ML IJ SOLN
140.0000 mg | Freq: Once | INTRAVENOUS | Status: AC
  Administered 2017-07-29: 140 mg via INTRAVENOUS
  Filled 2017-07-29: qty 3.5

## 2017-07-29 MED ORDER — FLEET ENEMA 7-19 GM/118ML RE ENEM: 1.0000 | ENEMA | Freq: Every day | RECTAL | Status: DC | PRN

## 2017-07-29 MED ORDER — ACETAMINOPHEN 325 MG PO TABS
325.0000 mg | ORAL_TABLET | Freq: Once | ORAL | Status: AC
  Administered 2017-07-29: 325 mg via ORAL
  Filled 2017-07-29: qty 1

## 2017-07-29 MED ORDER — SODIUM CHLORIDE 0.9 % IV SOLN: 250.0000 mL | INTRAVENOUS | Status: DC | PRN

## 2017-07-29 MED ORDER — SODIUM CHLORIDE 0.9 % IV SOLN
2.0000 g | Freq: Four times a day (QID) | INTRAVENOUS | Status: DC
  Administered 2017-07-29: 2 g via INTRAVENOUS
  Filled 2017-07-29: qty 2
  Filled 2017-07-29 (×2): qty 2000

## 2017-07-29 MED ORDER — BENZOCAINE-MENTHOL 20-0.5 % EX AERO
1.0000 "application " | INHALATION_SPRAY | CUTANEOUS | Status: DC | PRN
  Administered 2017-07-29: 1 via TOPICAL
  Filled 2017-07-29: qty 56

## 2017-07-29 MED ORDER — BISACODYL 10 MG RE SUPP: 10.0000 mg | Freq: Every day | RECTAL | Status: DC | PRN

## 2017-07-29 MED ORDER — SODIUM CHLORIDE 0.9% FLUSH: 3.0000 mL | Freq: Two times a day (BID) | INTRAVENOUS | Status: DC

## 2017-07-29 MED ORDER — WITCH HAZEL-GLYCERIN EX PADS
1.0000 "application " | MEDICATED_PAD | CUTANEOUS | Status: DC | PRN
  Administered 2017-07-29: 1 via TOPICAL

## 2017-07-29 MED ORDER — SENNOSIDES-DOCUSATE SODIUM 8.6-50 MG PO TABS
2.0000 | ORAL_TABLET | ORAL | Status: DC
  Administered 2017-07-29: 2 via ORAL
  Filled 2017-07-29: qty 2

## 2017-07-29 NOTE — Lactation Note (Signed)
This note was copied from a baby's chart. Lactation Consultation Note  Patient Name: Boy Ica Daye TJQZE'S Date: 07/29/2017 Reason for consult: Initial assessment;NICU baby;1st time breastfeeding;Early term 37-38.6wks   Initial consult with mom of 41 hour old NICU infant. Mom reports she has been to visit the infant, she was too afraid to hold him.   Mom has pumped once and obtained a few gtts on the flange. Enc mom to collect all she can and take to infant in the NICU. Worked with mom on hand expression. Mom with small semi compressible breasts with flat nipples. Nipples do evert some with stimulation.   Providing Milk for Your Babe in NICU Booklet given. Enc mom to pump 8-12 x in 24 hours and follow with hand expression. Reviewed colostrum, milk coming to volume and what to expect with pumping. Reviewed labeling and storage of breast milk.   BF Resources handout and [place] Brochure given, mom informed of IP/OP services, BF Support Groups and [place] phone #. Mom has Spectra pump at home for use.   Mom reports she has no questions/concerns as needed.   Maternal Data Formula Feeding for Exclusion: No Has patient been taught Hand Expression?: Yes Does the patient have breastfeeding experience prior to this delivery?: No  Feeding    LATCH Score                   Interventions Interventions: Hand express  Lactation Tools Discussed/Used WIC Program: No Pump Review: Setup, frequency, and cleaning;Milk Storage Initiated by:: Reviewed and encouraged every 2-3 hours and followed by hand expression   Consult Status Consult Status: Follow-up Date: 07/30/17 Follow-up type: In-patient    Debby Freiberg Elaine Middleton 07/29/2017, 12:32 PM

## 2017-07-29 NOTE — Progress Notes (Signed)
Post Partum Day 0 s/p SVD at [redacted]w[redacted]d complicated by Triple I and fetal tachycardia; baby in NICU after needing PPV after birth  Subjective: No complaints and up ad lib. Tolerating po intake. Baby doing well as per patient  Objective: Blood pressure (!) 93/59, pulse 77, temperature 99.1 F (37.3 C), temperature source Oral, resp. rate 16, height 5' 1.5" (1.562 m), weight 135 lb 8 oz (61.5 kg), last menstrual period 11/06/2016, SpO2 98 %.  Physical Exam:  General: alert and no distress Lochia: appropriate Uterine Fundus: firm, NT DVT Evaluation: No evidence of DVT seen on physical exam. Negative Homan's sign. No cords or calf tenderness.  Recent Labs    07/28/17 2100  HGB 11.2*  HCT 33.0*    Assessment/Plan: No evidence of continued intrauterine infection Continue to encourage breastfeeding Regular diet; encouraged OOB Routine antenatal care   LOS: 1 day   Verita Schneiders, MD 07/29/2017, 11:02 AM

## 2017-07-29 NOTE — Progress Notes (Addendum)
Patient ID: Betty Acosta, female   DOB: December 19, 1985, 32 y.o.   MRN: [id number] Betty Acosta is a 32 y.o. G1P0 at [redacted]w[redacted]d admitted for active labor  Subjective: Nausea, pressure-wants to push  Objective: BP 131/71   Pulse 82   Temp 99 F (37.2 C) (Axillary)   Resp 16   Ht 5' 1.5" (1.562 m)   Wt 61.5 kg (135 lb 8 oz)   LMP 11/06/2016 (Exact Date)   BMI 25.19 kg/m  Total I/O In: -  Out: 300 [Urine:300]  FHT:  FHR: 150 bpm, variability: moderate,  accelerations:  Abscent,  decelerations:  Present mild variables w/ pushing UC:   q 2-16mins  SVE:   Dilation: 10 Effacement (%): 100 Station: 0 Exam by:: [employer] RN   SROM large amount clear fluid with first pushes  Labs: Lab Results  Component Value Date   WBC 12.1 (H) 07/28/2017   HGB 11.2 (L) 07/28/2017   HCT 33.0 (L) 07/28/2017   MCV 88.7 07/28/2017   PLT 215 07/28/2017    Assessment / Plan: Spontaneous labor, progressing normally  Labor: Progressing normally Fetal Wellbeing:  Category II Pain Control:  Epidural Pre-eclampsia: n/a I/D:  Amp/gent for chorioamnionitis Anticipated MOD:  NSVD  Roma Schanz CNM, WHNP-BC 07/29/2017, 2:50 AM

## 2017-07-29 NOTE — Progress Notes (Signed)
Betty Acosta is a 32 y.o. G1P0 at [redacted]w[redacted]d admitted for active labor  Subjective: Comfortable w/ epidural  Objective: BP 124/68   Pulse 73   Temp 100.1 F (37.8 C) (Axillary)   Resp 17   Ht 5' 1.5" (1.562 m)   Wt 61.5 kg (135 lb 8 oz)   LMP 11/06/2016 (Exact Date)   BMI 25.19 kg/m  No intake/output data recorded.  FHT:  FHR: 175 bpm, variability: moderate,  accelerations:  Present,  decelerations:  Absent UC:   regular, every 2 minutes  SVE:   Dilation: 7 Effacement (%): 100 Station: -2 Exam by:: Betty Ramp, RN   Declines AROM at this time  Labs: Lab Results  Component Value Date   WBC 12.1 (H) 07/28/2017   HGB 11.2 (L) 07/28/2017   HCT 33.0 (L) 07/28/2017   MCV 88.7 07/28/2017   PLT 215 07/28/2017    Assessment / Plan: SOL, now w/ temp 100.1 axillary and fetal tachycardia, despite intact membranes will treat for suspected chorioamnionitis. Plan AROM if no change on next exam  Labor: Progressing normally Fetal Wellbeing:  Category II Pain Control:  Epidural Pre-eclampsia: n/a I/D:  Amp/gent/apap Anticipated MOD:  NSVD  Roma Schanz CNM, WHNP-BC 07/29/2017, 12:21 AM

## 2017-07-29 NOTE — Progress Notes (Signed)
Pharmacy Antibiotic Note  Betty Acosta is a 32 y.o. female admitted on 07/28/2017 with active labor. Starting ampicillin and gentamicin for maternal temperature and suspected chorioamnionitis.  Pharmacy has been consulted for gentamicin dosing.  Plan: gentamicin 140mg  IV x 1 followed by gentamicin 130mg  IV Q8 hours  Height: 5' 1.5" (156.2 cm) Weight: 135 lb 8 oz (61.5 kg) IBW/kg (Calculated) : 48.95  Temp (24hrs), Avg:99.1 F (37.3 C), Min:98.4 F (36.9 C), Max:100.1 F (37.8 C)  Recent Labs  Lab 07/28/17 2100  WBC 12.1*    CrCl cannot be calculated (No order found.).    No Known Allergies  Antimicrobials this admission: Ampicillin 2 grams Q6 hours  5/8/ >>  Gentamicin  5/8 >>    Thank you for allowing pharmacy to be a part of this patient's care.  Nyra Capes 07/29/2017 12:38 AM

## 2017-07-29 NOTE — Plan of Care (Signed)
Pt and family educated on labor process and pushing. Progress being made towards SVD, will continue to monitor and communicate with team

## 2017-07-29 NOTE — Anesthesia Postprocedure Evaluation (Signed)
Anesthesia Post Note  Patient: Betty Acosta  Procedure(s) Performed: AN AD HOC LABOR EPIDURAL     Patient location during evaluation: [facility] Anesthesia Type: Epidural Level of consciousness: awake Pain management: pain level controlled Vital Signs Assessment: post-procedure vital signs reviewed and stable Respiratory status: spontaneous breathing Cardiovascular status: stable Postop Assessment: epidural receding and patient able to bend at knees Anesthetic complications: no    Last Vitals:  Vitals:   07/29/17 1200 07/29/17 1226  BP: 98/66 98/66  Pulse: 72 72  Resp: 16 18  Temp: 36.6 C   SpO2: 100% 100%    Last Pain:  Vitals:   07/29/17 1200  TempSrc: Oral  PainSc: 0-No pain   Pain Goal:                 Everette Rank

## 2017-07-29 NOTE — Consult Note (Signed)
Neonatology Note:   Attendance at Delivery:    I was asked by K. Booker, CNM for Dr. Nehemiah Settle to attend this NSVD at 37 6/7 weeks due to late and variable FHR decelerations and possible incipient chorioamnionitis. The mother is a G1P0 O pos, GBS neg with subseptate uterus, IBS and anxiety. ROM 1 hour prior to delivery, fluid clear. Mother had temperature to 100.1 axillary and was treated with 1 dose each of Ampicillin and Gentamicin about 2.5 hours before delivery. There was some fetal tachycardia, as well as late and variable decelerations. Infant was floppy, blue, apneic at birth: delayed cord clamping was not done. I performed bulb suctioning, noted HR was < 100, so gave PPV. HR rose quickly and color improved, but the baby did not have any sustained respiratory effort. I interrupted PPV about every minute to suction the mouth and nares, but HR would begin to decrease almost immediately, so resumed PPV; total PPV time was about 5 minutes. At that time, he began to breathe regularly, but without any crying, and remained without tone. O2 sat was in the 90s during PPV, but drifted down without support, so we started neopuff CPAP, with good results. Ap 2/5/6. Shown to his mother briefly; I spoke with both parents in DR, and baby's father accompanied [place] to the NICU.  Some muscle tone noted by about 12-15 minutes, crying at about 15-17 minutes. Cord pH 7.06 arterial, 7.14 venous.    Real Cons, MD

## 2017-07-29 NOTE — Telephone Encounter (Signed)
Lmom for pt to call us back to sch pp appt.  07-29-17  AS

## 2017-07-29 NOTE — Lactation Note (Signed)
This note was copied from a baby's chart. Lactation Consultation Note  Patient Name: Betty Acosta Date: 07/29/2017   Attempted to see mom, mom is sleeping currently. RN has plans to set up DEBP for mom when she awakens. Will follow up at a later time.      Maternal Data    Feeding    LATCH Score                   Interventions    Lactation Tools Discussed/Used     Consult Status      Donn Pierini 07/29/2017, 9:17 AM

## 2017-07-30 LAB — CBC
HCT: 25.2 % — ABNORMAL LOW (ref 36.0–46.0)
Hemoglobin: 8.3 g/dL — ABNORMAL LOW (ref 12.0–15.0)
MCH: 30.6 pg (ref 26.0–34.0)
MCHC: 32.9 g/dL (ref 30.0–36.0)
MCV: 93 fL (ref 78.0–100.0)
Platelets: 182 10*3/uL (ref 150–400)
RBC: 2.71 MIL/uL — AB (ref 3.87–5.11)
RDW: 13.9 % (ref 11.5–15.5)
WBC: 11.2 10*3/uL — AB (ref 4.0–10.5)

## 2017-07-30 MED ORDER — IBUPROFEN 600 MG PO TABS: 600.0000 mg | ORAL_TABLET | Freq: Four times a day (QID) | ORAL | 0 refills | Status: DC

## 2017-07-30 NOTE — Discharge Summary (Signed)
OB Discharge Summary     Patient Name: Betty Acosta DOB: Jun 03, 1985 MRN: [id number]  Date of admission: 07/28/2017 Delivering MD: Wells Guiles R   Date of discharge: 07/30/2017  Admitting diagnosis: 71.4WKS CTX Intrauterine pregnancy: [redacted]w[redacted]d     Secondary diagnosis:  Active Problems:   Normal labor  Additional problems: intrapartum fever     Discharge diagnosis: Term Pregnancy Delivered                                                                                                Post partum procedures:none  Augmentation: none  Complications: Intrauterine Inflammation or infection (Chorioamniotis)  Hospital course:  Onset of Labor With Vaginal Delivery     32 y.o. yo G1P0 at [redacted]w[redacted]d was admitted in Active Labor on 07/28/2017. Patient had an uncomplicated labor course as follows:  Membrane Rupture Time/Date: 2:15 AM ,07/29/2017   Intrapartum Procedures: Episiotomy: None [1]                                         Lacerations:  2nd degree [3]  Patient had a delivery of a Viable infant. 07/29/2017  Information for the patient's newborn:  Regnia, Mathwig [[id number]]  Delivery Method: Vaginal, Spontaneous(Filed from Delivery Summary)    She developed fever and fetal tachycardia intrapartum and received antibiotics.  She is ambulating, tolerating a regular diet, passing flatus, and urinating well. Patient is discharged home in stable condition on 07/30/17. Baby is in the NICU and doing well.  Physical exam  Vitals:   07/29/17 2008 07/29/17 2325 07/30/17 0435 07/30/17 0743  BP: 101/62 (!) 101/55 (!) 98/59 99/71  Pulse: 62 62 (!) 53 65  Resp: 16 18 16 18   Temp: 97.9 F (36.6 C) 98.3 F (36.8 C) 97.8 F (36.6 C) (!) 97.5 F (36.4 C)  TempSrc:    Oral  SpO2: 99% 100% 99% 100%  Weight:      Height:       General: alert, cooperative and no distress Lochia: appropriate Uterine Fundus: firm Incision: N/A DVT Evaluation: No evidence of DVT seen on physical exam. Labs: Lab  Results  Component Value Date   WBC 11.2 (H) 07/30/2017   HGB 8.3 (L) 07/30/2017   HCT 25.2 (L) 07/30/2017   MCV 93.0 07/30/2017   PLT 182 07/30/2017   No flowsheet data found.  Discharge instruction: per After Visit Summary and "Baby and Me Booklet".  After visit meds:  Allergies as of 07/30/2017   No Known Allergies     Medication List    STOP taking these medications   Doxylamine-Pyridoxine 10-10 MG Tbec   promethazine 25 MG tablet Commonly known as:  PHENERGAN     TAKE these medications   ibuprofen 600 MG tablet Commonly known as:  ADVIL,MOTRIN Take 1 tablet (600 mg total) by mouth every 6 (six) hours.   multivitamin-prenatal 27-0.8 MG Tabs tablet Take 1 tablet by mouth daily at 12 noon.       Diet: routine diet  Activity: Advance as tolerated. Pelvic rest for 6 weeks.   Outpatient follow up:4 weeks Follow up Appt:No future appointments. Follow up Visit:No follow-ups on file.  Postpartum contraception: Undecided  Newborn Data: Live born female  Birth Weight: 6 lb 0.7 oz (2740 g) APGAR: 2, 5  Newborn Delivery   Birth date/time:  07/29/2017 03:38:00 Delivery type:  Vaginal, Spontaneous     Baby Feeding: Breast Disposition:NICU   07/30/2017 Emeterio Reeve, MD

## 2017-07-30 NOTE — Lactation Note (Signed)
This note was copied from a baby's chart. Lactation Consultation Note  Patient Name: Betty Acosta [id number] Date: 07/30/2017 Reason for consult: Follow-up assessment;NICU baby;1st time breastfeeding;Early term 37-38.6wks   Follow up with mom of 41 hour old NICU infant. Mom has been getting a little colostrum to take to infant.   Mom with abrasions to areola to the left nipple. Suspect from flanges. She reports her areola pulls down into the barrel on low suction.  Gave mom size 21 flanges to use with the next pumping to see if it is better. Mom to use Coconut oil to nipples prior to pumping. Comfort gels given to use post pumping after EBM applied, instructions given for use and cleaning.   Mom has Spectra 2 pump for home use. Mom to take pump parts with her and to use when visiting infant in the NICU. Engorgement prevention/treatment reviewed with mom. Advised mom to bring milk to infant in a cooler with ice block.   Advised mom to call this afternoon to speak with Lactation if nipples are not feeling better. Mom voiced understanding.   Reminded mom of Lactation services IP and OP. Mom reports infant has no further questions/concerns at this time.    Maternal Data Formula Feeding for Exclusion: No Has patient been taught Hand Expression?: Yes Does the patient have breastfeeding experience prior to this delivery?: No  Feeding Feeding Type: Bottle Fed - Formula Nipple Type: Slow - flow Length of feed: 25 min  LATCH Score                   Interventions    Lactation Tools Discussed/Used WIC Program: No Pump Review: Setup, frequency, and cleaning;Milk Storage Initiated by:: Reviewed and encouraged every 2-3 hours and follow with hand expression   Consult Status Consult Status: Complete Follow-up type: Call as needed    Donn Pierini 07/30/2017, 9:36 AM

## 2017-07-31 ENCOUNTER — Ambulatory Visit: Payer: Self-pay

## 2017-07-31 NOTE — Lactation Note (Signed)
This note was copied from a baby's chart. Lactation Consultation Note  Patient Name: Betty Acosta RCVEL'F Date: 07/31/2017 Reason for consult: Follow-up assessment;NICU baby [name] is 27 hours old.  He has been bottle feeding and mom has been pumping.  She is not obtaining milk yet.  Reassured and discussed milk coming to volume and engorgement treatment.  Baby has not been to breast yet and mom would like assist.  Baby positioned in football hold on right.  Mom has semi flat nipples.  Baby opened wide but unable to latch.  16 mm nipple shield applied.  Baby latched well and suckled for 5 minutes then fell asleep.  Mom shown cross cradle hold on left side.  Baby latched easily with nipple shield and fed another 5 minutes.  No colostrum in shield when he came off.  Instructed to continue to put baby to breast with cues using nipple shield, post pump every three hours and continue to supplement baby with formula/expressed milk per volume parameters.  Mom has a Spectra pump for home use.  Lactation outpatient appointment recommended for next week.  Clinic notified to schedule.  Maternal Data    Feeding Feeding Type: Breast Fed Nipple Type: Slow - flow Length of feed: 10 min  LATCH Score Latch: Repeated attempts needed to sustain latch, nipple held in mouth throughout feeding, stimulation needed to elicit sucking reflex.  Audible Swallowing: None  Type of Nipple: Flat  Comfort (Breast/Nipple): Soft / non-tender  Hold (Positioning): Assistance needed to correctly position infant at breast and maintain latch.  LATCH Score: 5  Interventions Interventions: Breast feeding basics reviewed;Assisted with latch;Breast compression;Adjust position;Breast massage;Support pillows;Hand express;Position options  Lactation Tools Discussed/Used Tools: Nipple Shields Nipple shield size: 16   Consult Status Consult Status: Follow-up Follow-up type: [place], [place] 07/31/2017, 10:22  AM

## 2017-07-31 NOTE — Lactation Note (Signed)
This note was copied from a baby's chart. Lactation Consultation Note  Patient Name: Betty Acosta [id number] Date: 07/31/2017   NICU nurse called for lactation assistance because mother wanted to place infant at the breast. She asked lactation to stop for baby's next feeding around 3-3:30 am but when [facility] stopped by baby was asleep. Mom did not wish to disturb baby and she said he'll probably be asleep until 7 am. Asked mom to call again for latch assistance when needed. She's aware of [facility] services and will call PRN.  Maternal Data    Feeding Feeding Type: Formula Nipple Type: Slow - flow    Interventions    Lactation Tools Discussed/Used     Consult Status      Theodis Kinsel S Panfilo Ketchum 07/31/2017, 4:09 AM

## 2017-08-05 ENCOUNTER — Telehealth (HOSPITAL_COMMUNITY): Payer: Self-pay | Admitting: Lactation Services

## 2017-08-07 NOTE — Telephone Encounter (Signed)
Spoke to mom, she wanted to schedule an OP Lactation appt for her baby.

## 2017-08-24 ENCOUNTER — Encounter: Payer: Self-pay | Admitting: *Deleted

## 2017-08-27 ENCOUNTER — Encounter: Payer: Self-pay | Admitting: Women's Health

## 2017-08-27 ENCOUNTER — Ambulatory Visit (INDEPENDENT_AMBULATORY_CARE_PROVIDER_SITE_OTHER): Payer: 59 | Admitting: Women's Health

## 2017-08-27 NOTE — Patient Instructions (Signed)
NO SEX UNTIL AFTER YOU GET YOUR BIRTH CONTROL  

## 2017-08-27 NOTE — Progress Notes (Signed)
   POSTPARTUM VISIT Patient name: Betty Acosta MRN [id number]  Date of birth: Jun 10, 1985 Chief Complaint:   postpartum visit (discuss birth control options)  History of Present Illness:   Betty Acosta is a 32 y.o. G63P1001 Caucasian female being seen today for a postpartum visit. She is 5 weeks postpartum following a spontaneous vaginal delivery at 37.6 gestational weeks. Anesthesia: epidural. I have fully reviewed the prenatal and intrapartum course. Pregnancy complicated by subseptate uterus. Postpartum course has been uncomplicated. Bleeding thin lochia. Bowel function is normal. Bladder function is normal.  Patient is not sexually active. Last sexual activity: prior to birth of baby.  Contraception method is wants IUD.  [place] Postpartum Depression Screening: negative. Score 6.   Last pap 08/03/14.  Results were normal .  No LMP recorded.  Baby's course has been complicated by large measurement of head, had u/s and all normal. Baby is feeding by formula and pumped milk.  Review of Systems:   Pertinent items are noted in HPI Denies Abnormal vaginal discharge w/ itching/odor/irritation, headaches, visual changes, shortness of breath, chest pain, abdominal pain, severe nausea/vomiting, or problems with urination or bowel movements. Pertinent History Reviewed:  Reviewed past medical,surgical, obstetrical and family history.  Reviewed problem list, medications and allergies. OB History  Gravida Para Term Preterm AB Living  1 1 1     1   SAB TAB Ectopic Multiple Live Births          1    # Outcome Date GA Lbr Len/2nd Weight Sex Delivery Anes PTL Lv  1 Term     M Vag-Spont   LIV   Physical Assessment:   Vitals:   08/27/17 1214  BP: (!) 111/54  Pulse: 61  Weight: 118 lb 3.2 oz (53.6 kg)  Height: 5' 1.5" (1.562 m)  Body mass index is 21.97 kg/m.       Physical Examination:   General appearance: alert, well appearing, and in no distress  Mental status: alert, oriented to person, place,  and time  Skin: warm & dry   Cardiovascular: normal heart rate noted   Respiratory: normal respiratory effort, no distress   Breasts: deferred, no complaints   Abdomen: soft, non-tender   Pelvic: VULVA: normal appearing vulva with no masses, tenderness or lesions, UTERUS: uterus is normal size, shape, consistency and nontender  Rectal: no hemorrhoids  Extremities: no edema       No results found for this or any previous visit (from the past 24 hour(s)).  Assessment & Plan:  1) Postpartum exam 2) 5 wks s/p SVB 3) Breast & bottlefeeding 4) Depression screening 5) Contraception counseling, pt prefers abstinence until IUD insertion  Meds: No orders of the defined types were placed in this encounter.   Follow-up: Return for first available for pap & physial then IUD insert 1wk later.   No orders of the defined types were placed in this encounter.   [place], [facility] 08/27/2017 12:49 PM

## 2017-09-07 ENCOUNTER — Telehealth: Payer: Self-pay | Admitting: Women's Health

## 2017-09-07 NOTE — Telephone Encounter (Signed)
Need a  Nurse to call in ref to something she needs to take and wants to know if she can  Take breast feeding?

## 2017-09-08 NOTE — Telephone Encounter (Signed)
Patient state she is taking Keflex for an infection and wants to make sure it is fine to take.  Spoke to FCD and states that is fine. Pt informed.

## 2017-09-14 ENCOUNTER — Other Ambulatory Visit: Payer: 59 | Admitting: Women's Health

## 2017-09-21 ENCOUNTER — Other Ambulatory Visit (HOSPITAL_COMMUNITY): Payer: Self-pay | Admitting: Adult Health Nurse Practitioner

## 2017-09-21 ENCOUNTER — Ambulatory Visit: Payer: 59 | Admitting: Women's Health

## 2017-09-21 ENCOUNTER — Ambulatory Visit (HOSPITAL_COMMUNITY): Payer: 59

## 2017-09-21 ENCOUNTER — Other Ambulatory Visit (HOSPITAL_COMMUNITY)
Admission: RE | Admit: 2017-09-21 | Discharge: 2017-09-21 | Disposition: A | Discharge status: Home / Self Care | Payer: Managed Care, Other (non HMO) | Source: Ambulatory Visit | Attending: Obstetrics & Gynecology | Admitting: Obstetrics & Gynecology

## 2017-09-21 ENCOUNTER — Ambulatory Visit (INDEPENDENT_AMBULATORY_CARE_PROVIDER_SITE_OTHER): Payer: 59 | Admitting: Women's Health

## 2017-09-21 ENCOUNTER — Encounter: Payer: Self-pay | Admitting: Women's Health

## 2017-09-21 VITALS — BP 119/75 | HR 62 | Ht 62.0 in | Wt 116.6 lb

## 2017-09-21 DIAGNOSIS — Z01419 Encounter for gynecological examination (general) (routine) without abnormal findings: Secondary | ICD-10-CM

## 2017-09-21 DIAGNOSIS — G8929 Other chronic pain: Secondary | ICD-10-CM

## 2017-09-21 DIAGNOSIS — M79672 Pain in left foot: Principal | ICD-10-CM

## 2017-09-21 DIAGNOSIS — Z1151 Encounter for screening for human papillomavirus (HPV): Secondary | ICD-10-CM | POA: Insufficient documentation

## 2017-09-21 NOTE — Patient Instructions (Signed)
NO SEX UNTIL AFTER YOU GET YOUR BIRTH CONTROL  

## 2017-09-21 NOTE — Progress Notes (Signed)
   WELL-WOMAN EXAMINATION Patient name: Betty Acosta MRN [id number]  Date of birth: 07-12-1985 Chief Complaint:   Gynecologic Exam  History of Present Illness:   Betty Acosta is a 32 y.o. G62P1001 Caucasian female being seen today for a routine well-woman exam.  Current complaints: none Has appt for IUD insertion 7/9, has been abstinent since SVB  PCP: none      does not desire labs Patient's last menstrual period was 09/08/2017. The current method of family planning is abstinence Last pap 08/03/14. Results were: normal Last mammogram: never. Results were: n/a Last colonoscopy: never. Results were: n/a  Review of Systems:   Pertinent items are noted in HPI Denies any headaches, blurred vision, fatigue, shortness of breath, chest pain, abdominal pain, abnormal vaginal discharge/itching/odor/irritation, problems with periods, bowel movements, urination, or intercourse unless otherwise stated above. Pertinent History Reviewed:  Reviewed past medical,surgical, social and family history.  Reviewed problem list, medications and allergies. Physical Assessment:   Vitals:   09/21/17 1123  BP: 119/75  Pulse: 62  Weight: 116 lb 9.6 oz (52.9 kg)  Height: 5\' 2"  (1.575 m)  Body mass index is 21.33 kg/m.        Physical Examination:   General appearance - well appearing, and in no distress  Mental status - alert, oriented to person, place, and time  Psych:  She has a normal mood and affect  Skin - warm and dry, normal color, no suspicious lesions noted  Chest - effort normal, all lung fields clear to auscultation bilaterally  Heart - normal rate and regular rhythm  Neck:  midline trachea, no thyromegaly or nodules  Breasts - breasts appear normal, no suspicious masses, no skin or nipple changes or  axillary nodes  Abdomen - soft, nontender, nondistended, no masses or organomegaly  Pelvic - VULVA: normal appearing vulva with no masses, tenderness or lesions  VAGINA: normal appearing vagina with  normal color and discharge, no lesions  CERVIX: normal appearing cervix without discharge or lesions, no CMT, no cervical polyp seen today  Thin prep pap is done w/ HR HPV cotesting  UTERUS: uterus is felt to be normal size, shape, consistency and nontender   ADNEXA: No adnexal masses or tenderness noted.  Extremities:  No swelling or varicosities noted  No results found for this or any previous visit (from the past 24 hour(s)).  Assessment & Plan:  1) Well-Woman Exam  2) Subseptate uterus> discussed w/ LHE, reviewed hysterogram which indicated normal endometrial contour 2016, ok to proceed w/ IUD as planned  Labs/procedures today: pap  Mammogram @30yo  or sooner if problems Colonoscopy @30yo  or sooner if problems  No orders of the defined types were placed in this encounter.   Follow-up: Return for As scheduled for IUD insertion., abstinence until insertion  [place], [facility] 09/21/2017 2:10 PM

## 2017-09-22 LAB — CYTOLOGY - PAP
CHLAMYDIA, DNA PROBE: NEGATIVE
DIAGNOSIS: NEGATIVE
HPV (WINDOPATH): NOT DETECTED
NEISSERIA GONORRHEA: NEGATIVE

## 2017-09-29 ENCOUNTER — Ambulatory Visit: Payer: 59 | Admitting: Women's Health

## 2019-07-20 ENCOUNTER — Encounter: Payer: Self-pay | Admitting: Advanced Practice Midwife

## 2019-07-20 ENCOUNTER — Other Ambulatory Visit: Payer: Self-pay

## 2019-07-20 ENCOUNTER — Ambulatory Visit (INDEPENDENT_AMBULATORY_CARE_PROVIDER_SITE_OTHER): Payer: Managed Care, Other (non HMO) | Admitting: Advanced Practice Midwife

## 2019-07-20 VITALS — BP 119/54 | HR 73 | Ht 62.0 in | Wt 118.5 lb

## 2019-07-20 DIAGNOSIS — N6310 Unspecified lump in the right breast, unspecified quadrant: Secondary | ICD-10-CM

## 2019-07-20 DIAGNOSIS — D241 Benign neoplasm of right breast: Secondary | ICD-10-CM | POA: Insufficient documentation

## 2019-07-20 DIAGNOSIS — N63 Unspecified lump in unspecified breast: Secondary | ICD-10-CM

## 2019-07-20 DIAGNOSIS — Z01411 Encounter for gynecological examination (general) (routine) with abnormal findings: Secondary | ICD-10-CM

## 2019-07-20 NOTE — Progress Notes (Signed)
WELL-WOMAN EXAMINATION Patient name: Betty Acosta MRN [id number]  Date of birth: 07-Jan-1986 Chief Complaint:   Gynecologic Exam  History of Present Illness:   Betty Acosta is a 34 y.o. G16P1001 female being seen today for a routine well-woman exam.  Current complaints: noticed lump in R breast approx 1 month ago; nontender; has had prior evaluation of lump in R axilla with dx of 'fatty tissue'; wants to try to conceive in the near future; has some questions re dx of chorioamnionitis with first delivery  Depression screen [facility] 2/9 07/20/2019 09/21/2017 01/07/2017 12/11/2016 02/01/2016  Decreased Interest 0 0 0 0 0  Down, Depressed, Hopeless 0 0 0 0 0  PHQ - 2 Score 0 0 0 0 0  Altered sleeping 0 - 1 - -  Tired, decreased energy 1 - 2 - -  Change in appetite 0 - 0 - -  Feeling bad or failure about yourself  0 - 0 - -  Trouble concentrating 0 - 0 - -  Moving slowly or fidgety/restless 0 - 0 - -  Suicidal thoughts 0 - 0 - -  PHQ-9 Score 1 - 3 - -  Difficult doing work/chores Not difficult at all - Not difficult at all - -     PCP: Dr Nevada Crane      does not desire labs Patient's last menstrual period was 07/12/2019. The current method of family planning is condoms currently, but wants to try to conceive soon.  Last pap July 2019. Results were: normal. H/O abnormal pap: no Last mammogram: never.  Family h/o breast cancer: no Last colonoscopy: never. Family h/o colorectal cancer: yes , materal aunt dx around age 18 Review of Systems:   Pertinent items are noted in HPI Denies any headaches, blurred vision, fatigue, shortness of breath, chest pain, abdominal pain, abnormal vaginal discharge/itching/odor/irritation, problems with periods, bowel movements, urination, or intercourse unless otherwise stated above. Pertinent History Reviewed:  Reviewed past medical,surgical, social and family history.  Reviewed problem list, medications and allergies. Physical Assessment:   Vitals:   07/20/19 1148    BP: (!) 119/54  Pulse: 73  Weight: 118 lb 8 oz (53.8 kg)  Height: 5\' 2"  (1.575 m)  Body mass index is 21.67 kg/m.        Physical Examination:   General appearance - well appearing, and in no distress  Mental status - alert, oriented to person, place, and time  Psych:  She has a normal mood and affect  Skin - warm and dry, normal color, no suspicious lesions noted  Chest - effort normal, all lung fields clear to auscultation bilaterally  Breasts: R with pea-sized, movable mass in mid outer area, approx 9 o'clock; L no masses palp  Heart - normal rate and regular rhythm  Neck:  midline trachea, no thyromegaly or nodules  Abdomen - soft, nontender, nondistended, no masses or organomegaly  Pelvic - not done  Thin prep pap is not done- wants to wait until 4yrs since last Pap     Extremities:  No swelling or varicosities noted   No results found for this or any previous visit (from the past 24 hour(s)).  Assessment & Plan:  1) Well-Woman Exam  2) Breast mass, will schedule dx mammogram[facility]ultrasound at [facility] (May 11th)   Labs/procedures today: none   Screening mammogram age 45 or sooner if problems Colonoscopy age 75 or sooner if problems  Orders Placed This Encounter  Procedures  . MM DIAG BREAST TOMO BILATERAL  .  US BREAST COMPLETE UNI RIGHT INC AXILLA    Meds: No orders of the defined types were placed in this encounter.   Follow-up: Return in about 1 year (around 07/19/2020) for Pap & Physical.  Myrtis Ser [facility] 07/20/2019 2:05 PM

## 2019-08-02 ENCOUNTER — Ambulatory Visit (HOSPITAL_COMMUNITY): Payer: Managed Care, Other (non HMO)

## 2019-08-02 ENCOUNTER — Encounter (HOSPITAL_COMMUNITY): Payer: Managed Care, Other (non HMO)

## 2019-08-16 ENCOUNTER — Encounter (HOSPITAL_COMMUNITY): Payer: Managed Care, Other (non HMO)

## 2019-08-16 ENCOUNTER — Other Ambulatory Visit (HOSPITAL_COMMUNITY): Payer: Managed Care, Other (non HMO)

## 2019-08-16 ENCOUNTER — Other Ambulatory Visit: Payer: Self-pay | Admitting: Advanced Practice Midwife

## 2019-08-16 ENCOUNTER — Other Ambulatory Visit: Payer: Self-pay

## 2019-08-16 ENCOUNTER — Ambulatory Visit (HOSPITAL_COMMUNITY)
Admission: RE | Admit: 2019-08-16 | Discharge: 2019-08-16 | Disposition: A | Discharge status: Home / Self Care | Payer: Managed Care, Other (non HMO) | Source: Ambulatory Visit | Attending: Advanced Practice Midwife | Admitting: Advanced Practice Midwife

## 2019-08-16 DIAGNOSIS — N63 Unspecified lump in unspecified breast: Secondary | ICD-10-CM | POA: Diagnosis not present

## 2019-08-23 ENCOUNTER — Ambulatory Visit (HOSPITAL_COMMUNITY)
Admission: RE | Admit: 2019-08-23 | Discharge: 2019-08-23 | Disposition: A | Discharge status: Home / Self Care | Payer: Managed Care, Other (non HMO) | Source: Ambulatory Visit | Attending: Advanced Practice Midwife | Admitting: Advanced Practice Midwife

## 2019-08-23 ENCOUNTER — Other Ambulatory Visit: Payer: Self-pay

## 2019-08-23 ENCOUNTER — Other Ambulatory Visit (HOSPITAL_COMMUNITY): Payer: Self-pay | Admitting: Advanced Practice Midwife

## 2019-08-23 DIAGNOSIS — R928 Other abnormal and inconclusive findings on diagnostic imaging of breast: Secondary | ICD-10-CM

## 2019-08-23 DIAGNOSIS — N63 Unspecified lump in unspecified breast: Secondary | ICD-10-CM | POA: Diagnosis not present

## 2019-08-23 MED ORDER — LIDOCAINE HCL (PF) 2 % IJ SOLN
INTRAMUSCULAR | Status: AC
  Filled 2019-08-23: qty 10

## 2019-08-23 MED ORDER — SODIUM BICARBONATE 4.2 % IV SOLN
INTRAVENOUS | Status: AC
  Filled 2019-08-23: qty 10

## 2019-08-23 MED ORDER — LIDOCAINE-EPINEPHRINE (PF) 1 %-1:200000 IJ SOLN
INTRAMUSCULAR | Status: AC
  Filled 2019-08-23: qty 30

## 2019-08-24 LAB — SURGICAL PATHOLOGY

## 2019-08-29 ENCOUNTER — Ambulatory Visit: Payer: Managed Care, Other (non HMO) | Admitting: Adult Health

## 2019-08-29 ENCOUNTER — Encounter: Payer: Self-pay | Admitting: Advanced Practice Midwife

## 2019-09-27 ENCOUNTER — Ambulatory Visit: Payer: Managed Care, Other (non HMO) | Admitting: Adult Health

## 2020-04-09 ENCOUNTER — Ambulatory Visit: Payer: Managed Care, Other (non HMO) | Admitting: Women's Health

## 2020-04-25 ENCOUNTER — Other Ambulatory Visit (HOSPITAL_COMMUNITY)
Admission: RE | Admit: 2020-04-25 | Discharge: 2020-04-25 | Disposition: A | Discharge status: Home / Self Care | Payer: Managed Care, Other (non HMO) | Source: Ambulatory Visit | Attending: Obstetrics & Gynecology | Admitting: Obstetrics & Gynecology

## 2020-04-25 ENCOUNTER — Encounter: Payer: Self-pay | Admitting: Women's Health

## 2020-04-25 ENCOUNTER — Ambulatory Visit (INDEPENDENT_AMBULATORY_CARE_PROVIDER_SITE_OTHER): Payer: Managed Care, Other (non HMO) | Admitting: Women's Health

## 2020-04-25 ENCOUNTER — Other Ambulatory Visit: Payer: Self-pay | Admitting: Women's Health

## 2020-04-25 ENCOUNTER — Other Ambulatory Visit: Payer: Self-pay

## 2020-04-25 VITALS — BP 122/81 | HR 56 | Ht 61.5 in | Wt 117.4 lb

## 2020-04-25 DIAGNOSIS — Z319 Encounter for procreative management, unspecified: Secondary | ICD-10-CM

## 2020-04-25 DIAGNOSIS — N9089 Other specified noninflammatory disorders of vulva and perineum: Secondary | ICD-10-CM | POA: Diagnosis not present

## 2020-04-25 DIAGNOSIS — Z1329 Encounter for screening for other suspected endocrine disorder: Secondary | ICD-10-CM

## 2020-04-25 NOTE — Progress Notes (Signed)
GYN VISIT Patient name: Betty Acosta MRN [id number]  Date of birth: Mar 15, 1986 Chief Complaint:   Infertility and Vaginal Discharge  History of Present Illness:   Betty Acosta is a 35 y.o. G6P1001 Caucasian female being seen today for report of trying to get pregnant x 52mths. Took 31mths to get pregnant w/ 1st baby, who is now 43yrs old. Same partner, he is 60yo. He had semen analysis prior to 1st baby and was fine then. She had hystrosalpingogram in 2016 and it was normal. 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 She is having regular periods that last 5 days, heavy x 1st 1-2d, not any cramping lately, some large clots occasionally.  She has been doing ovulation predictor kits and they are coming back +, and they are having sex on fertile days.  Also reports itching mainly on Rt labia x ~10mth, has been using epsom salt in bath which seems to help some.  Depression screen [facility] 2/9 07/20/2019 09/21/2017 01/07/2017 12/11/2016 02/01/2016  Decreased Interest 0 0 0 0 0  Down, Depressed, Hopeless 0 0 0 0 0  PHQ - 2 Score 0 0 0 0 0  Altered sleeping 0 - 1 - -  Tired, decreased energy 1 - 2 - -  Change in appetite 0 - 0 - -  Feeling bad or failure about yourself  0 - 0 - -  Trouble concentrating 0 - 0 - -  Moving slowly or fidgety/restless 0 - 0 - -  Suicidal thoughts 0 - 0 - -  PHQ-9 Score 1 - 3 - -  Difficult doing work/chores Not difficult at all - Not difficult at all - -    Patient's last menstrual period was 04/16/2020 (exact date). The current method of family planning is none.  Last pap 09/21/17. Results were: NILM w/ HRHPV negative Review of Systems:   Pertinent items are noted in HPI Denies fever/chills, dizziness, headaches, visual disturbances, fatigue, shortness of breath, chest pain, abdominal pain, vomiting, abnormal vaginal discharge/itching/odor/irritation, problems with periods, bowel movements, urination, or intercourse unless otherwise stated above.  Pertinent History Reviewed:  Reviewed  past medical,surgical, social, obstetrical and family history.  Reviewed problem list, medications and allergies. Physical Assessment:   Vitals:   04/25/20 1107  BP: 122/81  Pulse: (!) 56  Weight: 117 lb 6.4 oz (53.3 kg)  Height: 5' 1.5" (1.562 m)  Body mass index is 21.82 kg/m.       Physical Examination:   General appearance: alert, well appearing, and in no distress  Mental status: alert, oriented to person, place, and time  Skin: warm & dry   Cardiovascular: normal heart rate noted  Respiratory: normal respiratory effort, no distress  Abdomen: soft, non-tender   Pelvic: Rt labia minora w/ white patchy lesions, ? HSV, 2 lesions on Lt labia, culture obtained. Vagina/cx normal, CV swab obtained  Extremities: no edema   Chaperone: Angel Neas    No results found for this or any previous visit (from the past 24 hour(s)).  Assessment & Plan:  1) Trying to conceive> x38mths. Will check progesterone on day 21 (2/14), check TSH. Gave printed tips/tricks.   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   Orders Placed This Encounter  Procedures  . Herpes simplex virus culture  . TSH  . Progesterone    Return for 2/14 for labs; July for , Pap & physical.  Roma Schanz CNM,  WHNP-BC 04/25/2020 12:17 PM

## 2020-04-25 NOTE — Patient Instructions (Signed)
If you are trying to get pregnant:   Have sex every other day on days 7-24 of your cycle (Day 1 is the 1st day of your period)  Pee before sex  Lay with your hips elevated on pillows for 20-30mins after sex  Do not smoke or drink alcohol  Lose weight if you are overweight  Take a prenatal vitamin with at least 400mcg of folic acid  Decrease stress in your life  [email or web address]  For Him:   Wear boxers instead of briefs  Avoid hot baths/jacuzzi  Vit C supplement  Do not smoke or drink alcohol  Lose weight if you are overweight    

## 2020-04-26 ENCOUNTER — Other Ambulatory Visit: Payer: Self-pay | Admitting: Women's Health

## 2020-04-26 LAB — CERVICOVAGINAL ANCILLARY ONLY
Bacterial Vaginitis (gardnerella): POSITIVE — AB
Candida Glabrata: POSITIVE — AB
Candida Vaginitis: NEGATIVE
Chlamydia: NEGATIVE
Comment: NEGATIVE
Comment: NEGATIVE
Comment: NEGATIVE
Comment: NEGATIVE
Comment: NEGATIVE
Comment: NORMAL
Neisseria Gonorrhea: NEGATIVE
Trichomonas: NEGATIVE

## 2020-04-26 MED ORDER — FLUCONAZOLE 150 MG PO TABS
150.0000 mg | ORAL_TABLET | Freq: Once | ORAL | 0 refills | Status: AC
Start: 2020-04-26 — End: 2020-04-26

## 2020-04-26 MED ORDER — METRONIDAZOLE 500 MG PO TABS
500.0000 mg | ORAL_TABLET | Freq: Two times a day (BID) | ORAL | 0 refills | Status: DC
Start: 2020-04-26 — End: 2022-05-26

## 2020-04-27 LAB — HERPES SIMPLEX VIRUS CULTURE

## 2020-05-08 LAB — TSH: TSH: 1.96 u[IU]/mL (ref 0.450–4.500)

## 2020-05-08 LAB — PROGESTERONE: Progesterone: 12.4 ng/mL

## 2020-05-09 ENCOUNTER — Other Ambulatory Visit: Payer: Self-pay | Admitting: Women's Health

## 2020-05-09 MED ORDER — FLUCONAZOLE 150 MG PO TABS: 150.0000 mg | ORAL_TABLET | Freq: Once | ORAL | 0 refills | Status: AC

## 2020-05-22 ENCOUNTER — Other Ambulatory Visit: Payer: Self-pay | Admitting: Women's Health

## 2020-05-22 MED ORDER — NYSTATIN-TRIAMCINOLONE 100000-0.1 UNIT/GM-% EX OINT
1.0000 | TOPICAL_OINTMENT | Freq: Two times a day (BID) | CUTANEOUS | 0 refills | Status: DC
Start: 2020-05-22 — End: 2022-05-26

## 2020-07-17 ENCOUNTER — Other Ambulatory Visit: Payer: Self-pay | Admitting: Women's Health

## 2020-07-17 DIAGNOSIS — N979 Female infertility, unspecified: Secondary | ICD-10-CM

## 2020-08-04 LAB — PROLACTIN: Prolactin: 12.4 ng/mL (ref 4.8–23.3)

## 2021-09-10 IMAGING — US US BREAST*L* LIMITED INC AXILLA
1 series · 13 of 21 positions shown · non-contrast
Comparison: None.

CLINICAL DATA: 34-year-old female with palpable areas concern in
each breast.

EXAM:
DIGITAL DIAGNOSTIC BILATERAL MAMMOGRAM WITH CAD AND TOMO
BILATERAL BREAST ULTRASOUND

[Series 1: us breast ltd uni right inc axilla · 13 of 21 slices shown]
[im 1/21]
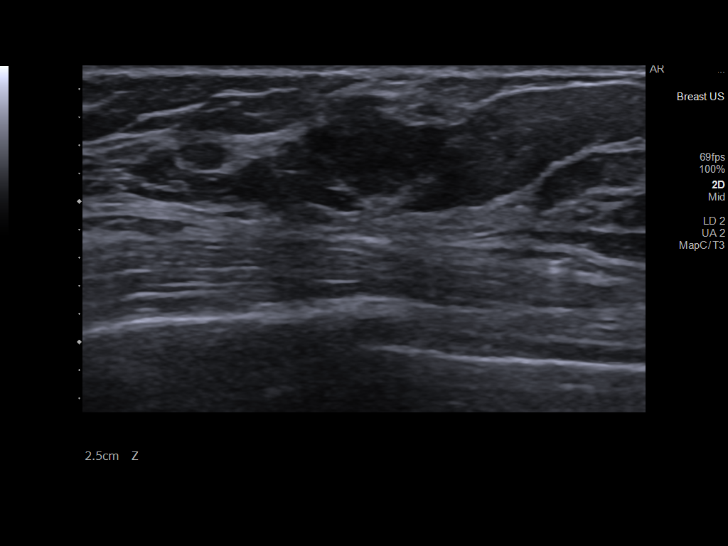
[im 3/21]
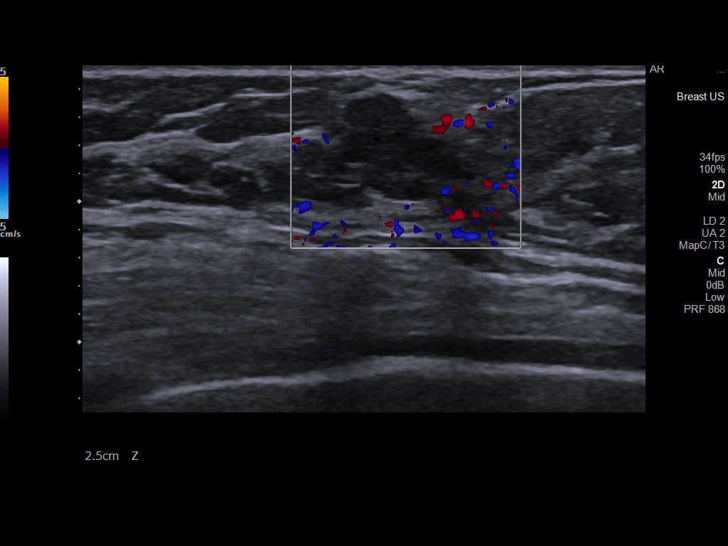
[im 5/21]
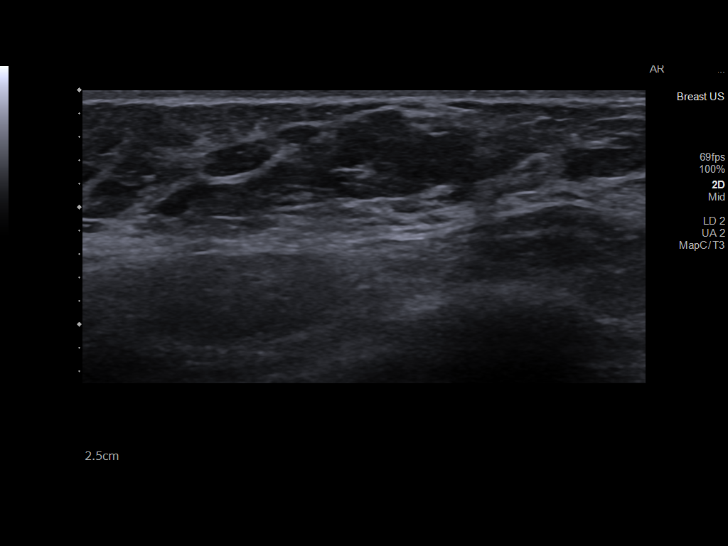
[im 6/21]
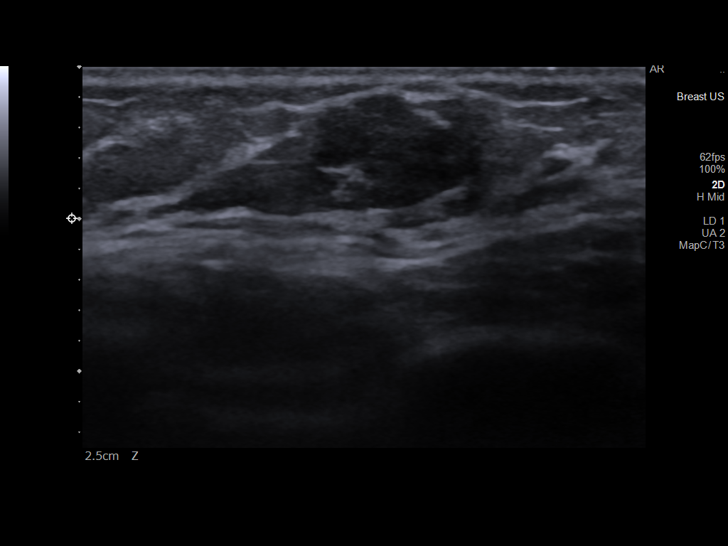
[im 8/21]
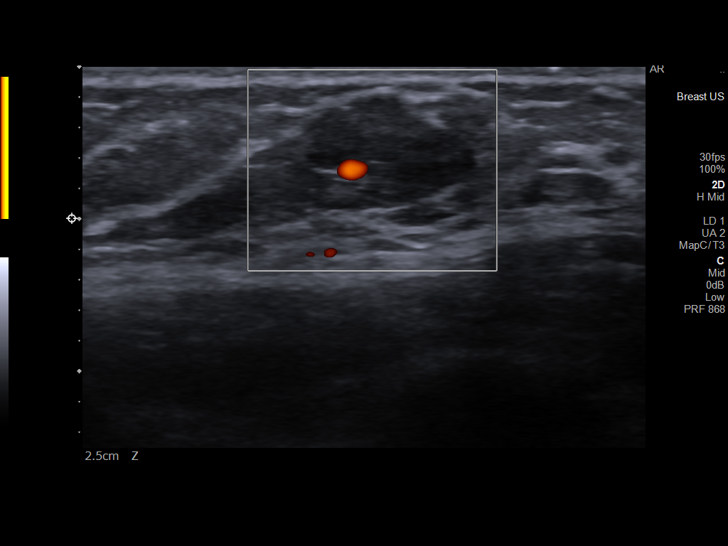
[im 9/21]
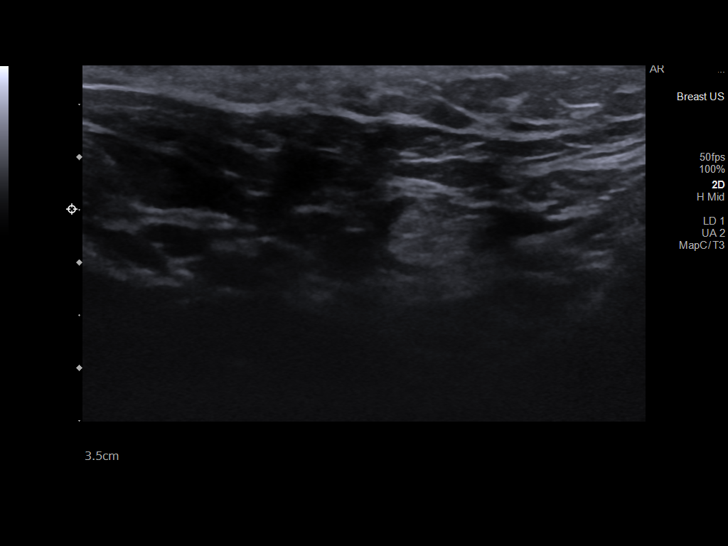
[im 11/21]
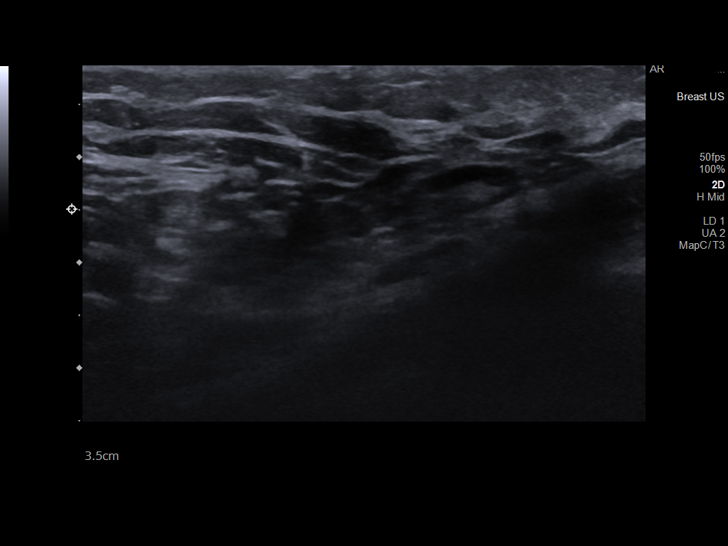
[im 13/21]
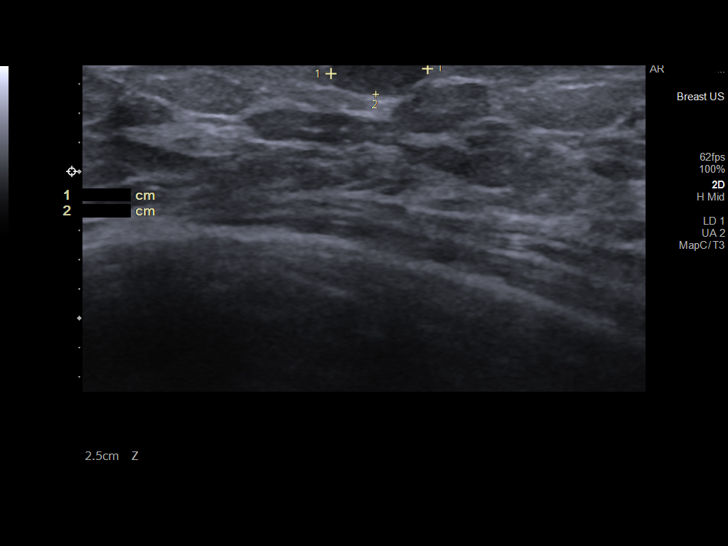
[im 14/21]
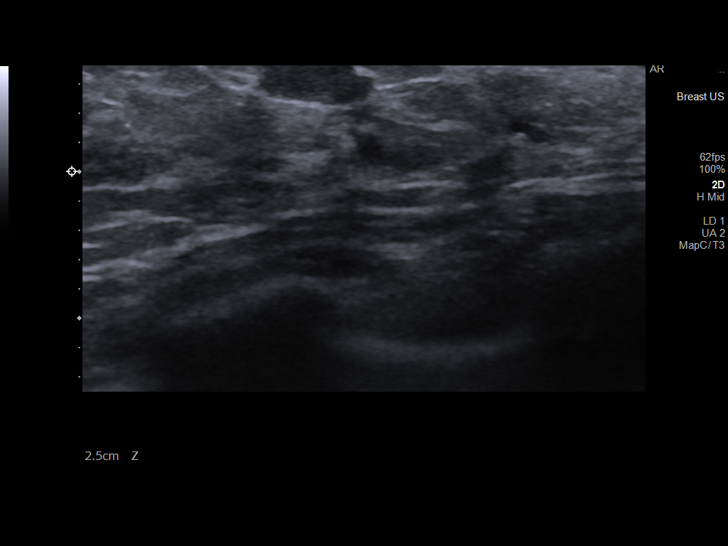
[im 16/21]
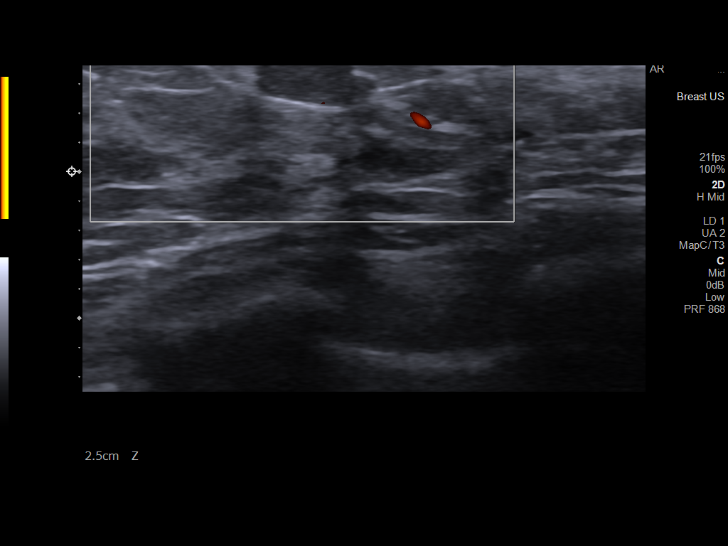
[im 17/21]
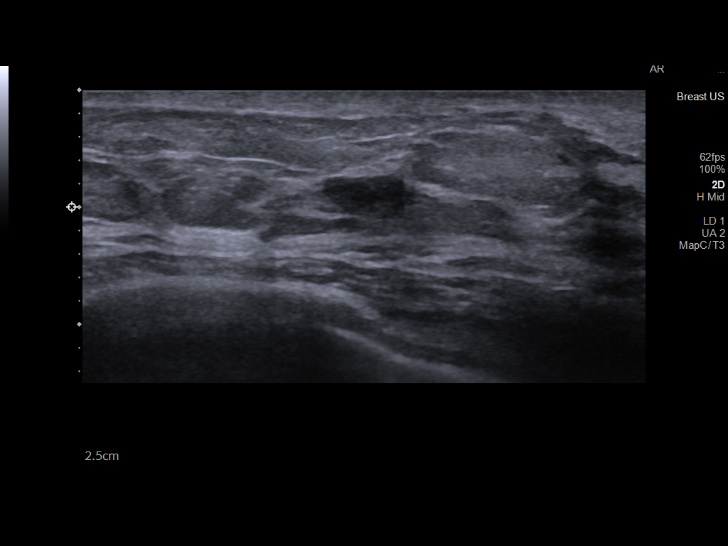
[im 19/21]
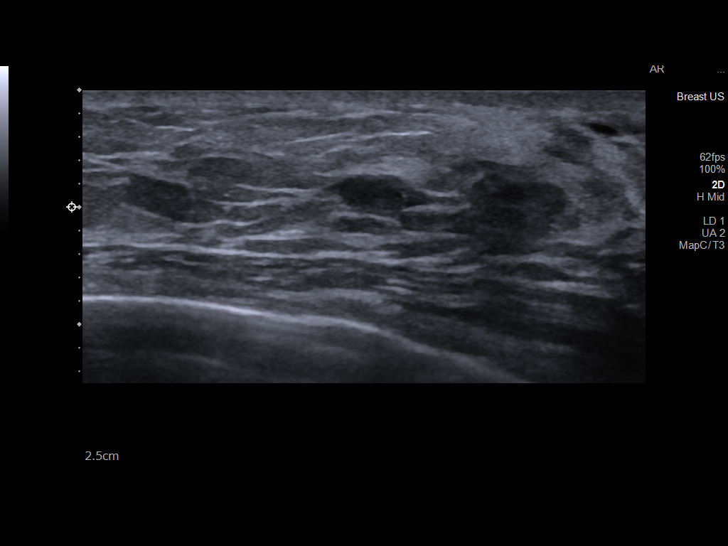
[im 21/21]
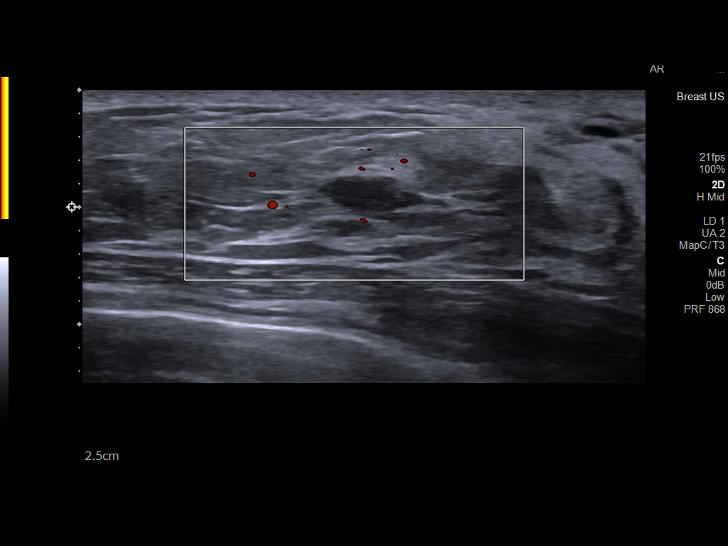

[13 of 21 positions shown; findings below may reference images not displayed]

ACR Breast Density Category c: The breast tissue is heterogeneously
dense, which may obscure small masses.
FINDINGS: There is an oval lobulated mass in the upper-outer right breast at
site of palpable concern measuring approximately 1.1 cm. There is an
oval mass with obscured margins corresponding to the site of
palpable concern in the inner left breast measuring approximately
0.9 cm. In addition there is an oval circumscribed mass in the lower
outer left breast measuring 0.7 cm.

Mammographic images were processed with CAD.

Physical examination of the outer right breast reveals a firm mobile
nodule. Physical examination in the inner left breast also reveals a
firm mobile nodule.

Targeted ultrasound of the outer right breast was performed. There
is an oval hypoechoic mass with lobulated margins at 9 o'clock 8 cm
from nipple measuring 1.4 x 0.8 x 1.1 cm. This corresponds well with
the mass seen in the outer right breast at mammography. No
lymphadenopathy seen in the right axilla.

Targeted ultrasound of the left breast was performed. There is an
oval circumscribed mass at 5 o'clock 2 cm from nipple measuring
x 0.3 x 0.7 cm. This corresponds with the mass seen in the lower
outer left breast at mammography. There is an oval circumscribed
hypoechoic mass at the [DATE] position 2 cm from nipple measuring
x 0.4 x 0.6 cm. This corresponds well with the mass seen in the
inner left breast at mammography. These masses demonstrate imaging
features most suggestive of benign fibroadenomas.
IMPRESSION: 1. Indeterminate mass in the right breast at the 9 o'clock position.

2. Probably benign masses in the left breast at the 5 o'clock and
[DATE] positions.

RECOMMENDATION:
1. Recommend ultrasound-guided biopsy of the mass in the right
breast at the 9 o'clock position.

2. Recommend six-month follow-up ultrasound of the probably benign
masses in the left breast. If biopsy of the right breast mass
returns as abnormal/malignancy, then recommend the patient return
for biopsy of these masses as well.

I have discussed the findings and recommendations with the patient.
If applicable, a reminder letter will be sent to the patient
regarding the next appointment.

BI-RADS CATEGORY  4: Suspicious.

## 2021-09-10 IMAGING — MG DIGITAL DIAGNOSTIC BILAT W/ TOMO W/ CAD
6 of 12 series · 6 of 36 positions shown · non-contrast
Comparison: None.

CLINICAL DATA: 34-year-old female with palpable areas concern in
each breast.

EXAM:
DIGITAL DIAGNOSTIC BILATERAL MAMMOGRAM WITH CAD AND TOMO
BILATERAL BREAST ULTRASOUND

[R MLO synth-2D]
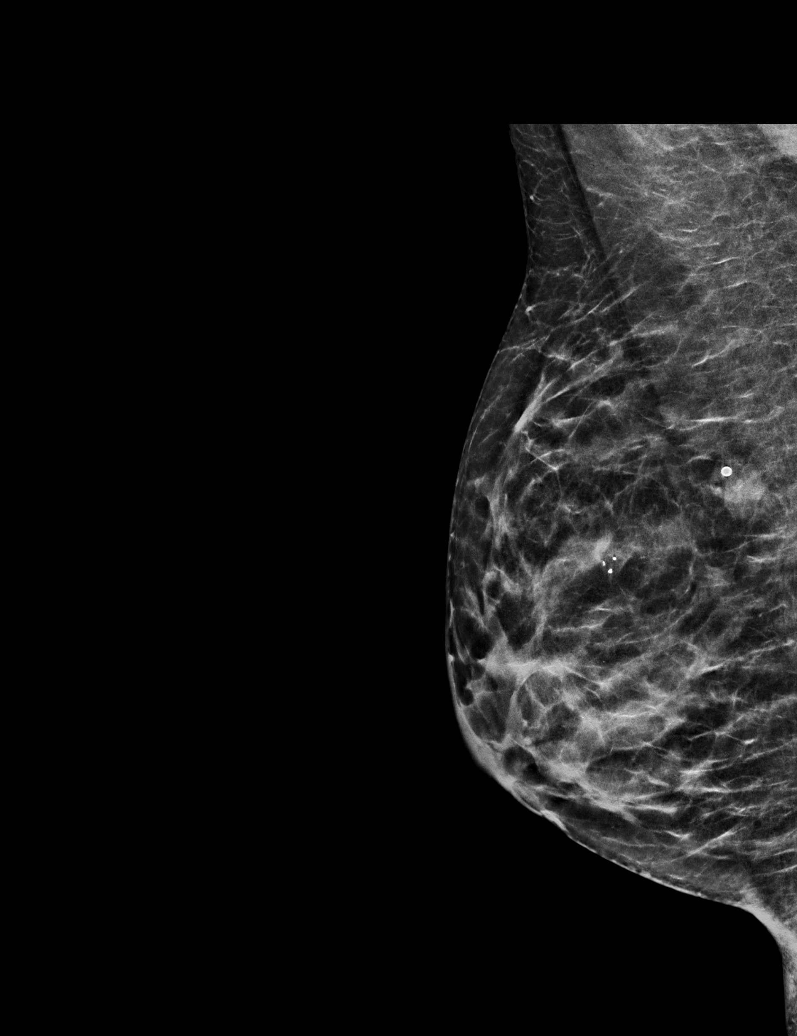

[R CC synth-2D (1 of 2)]
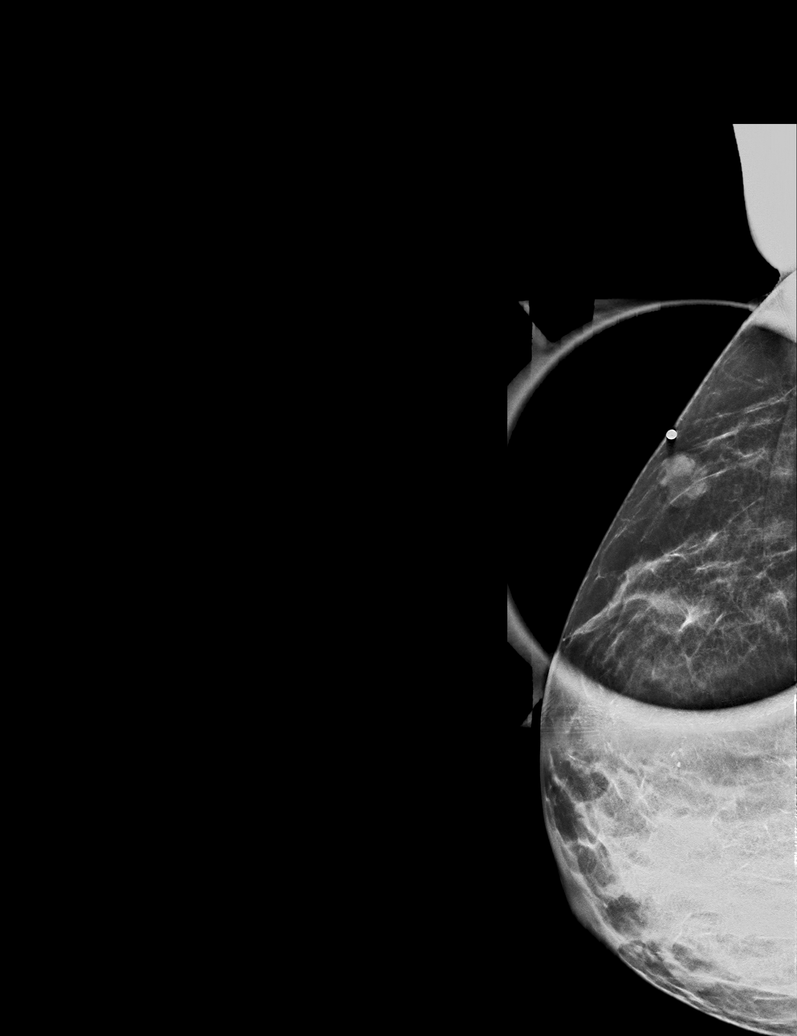

[L CC synth-2D]
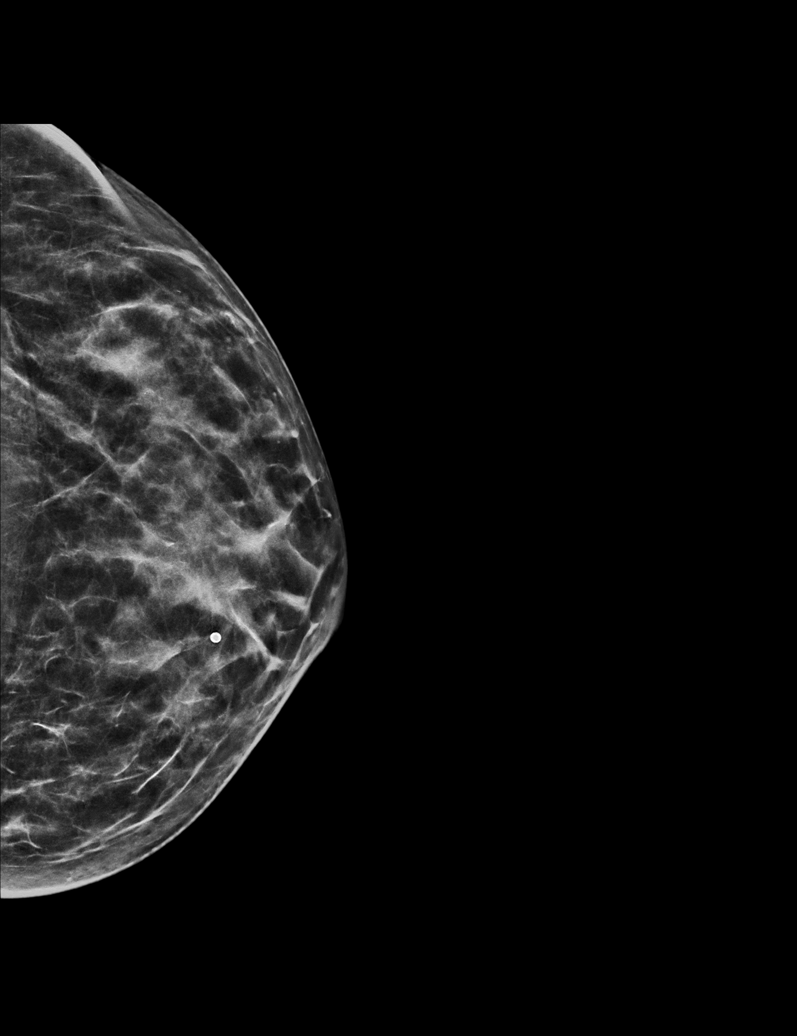

[R CC synth-2D (2 of 2)]
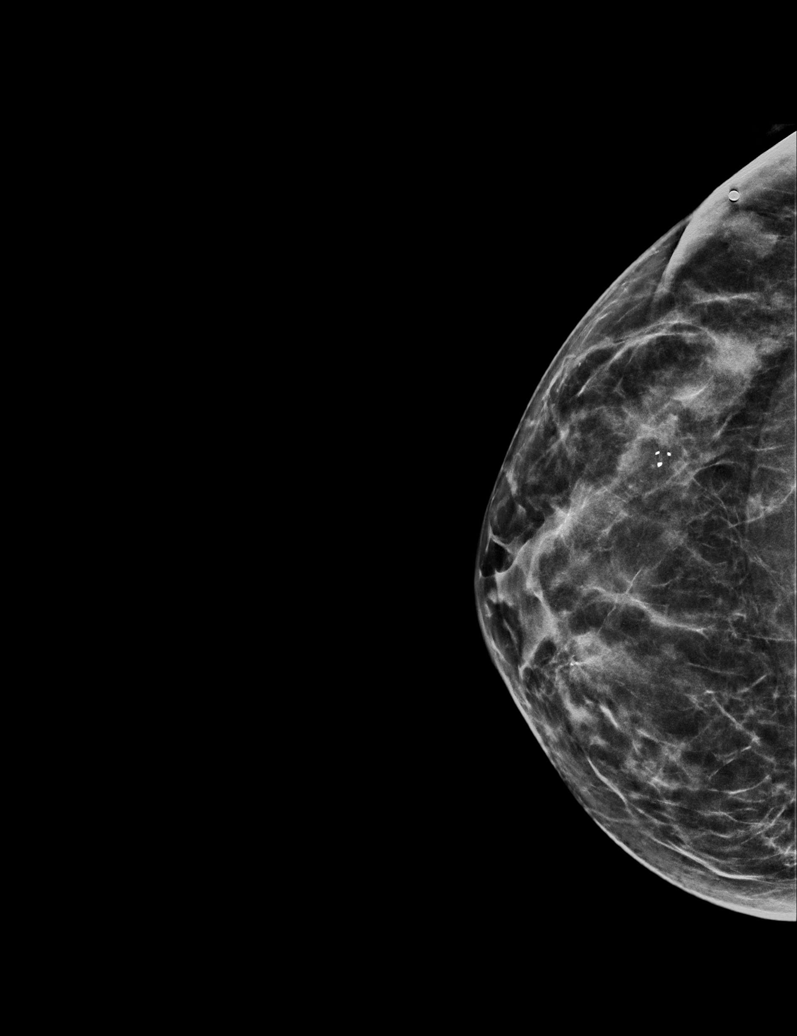

[L MLO synth-2D (1 of 2)]
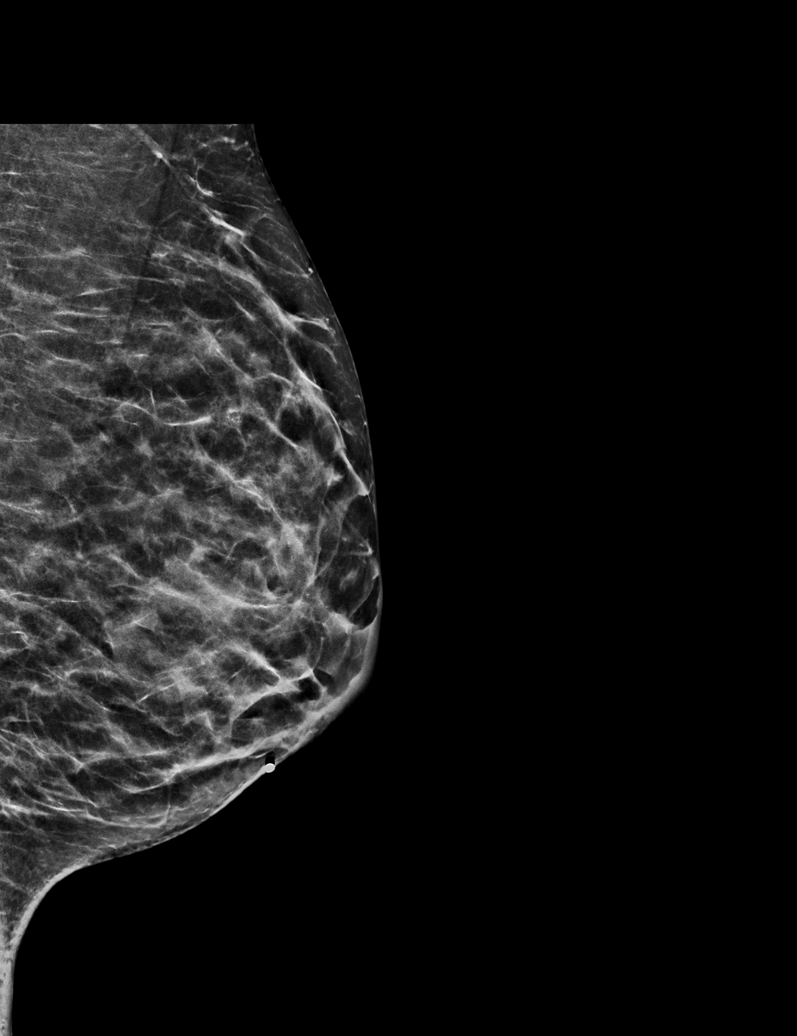

[L MLO synth-2D (2 of 2)]
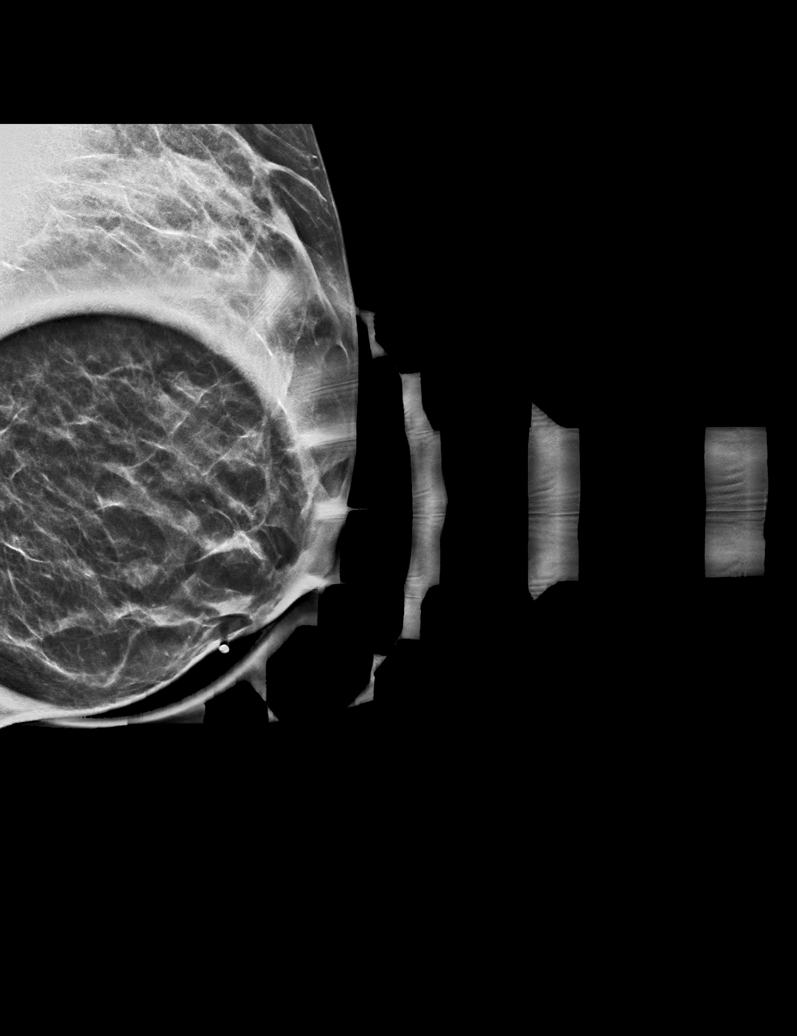

[6 of 36 positions shown; findings below may reference images not displayed]

ACR Breast Density Category c: The breast tissue is heterogeneously
dense, which may obscure small masses.
FINDINGS: There is an oval lobulated mass in the upper-outer right breast at
site of palpable concern measuring approximately 1.1 cm. There is an
oval mass with obscured margins corresponding to the site of
palpable concern in the inner left breast measuring approximately
0.9 cm. In addition there is an oval circumscribed mass in the lower
outer left breast measuring 0.7 cm.

Mammographic images were processed with CAD.

Physical examination of the outer right breast reveals a firm mobile
nodule. Physical examination in the inner left breast also reveals a
firm mobile nodule.

Targeted ultrasound of the outer right breast was performed. There
is an oval hypoechoic mass with lobulated margins at 9 o'clock 8 cm
from nipple measuring 1.4 x 0.8 x 1.1 cm. This corresponds well with
the mass seen in the outer right breast at mammography. No
lymphadenopathy seen in the right axilla.

Targeted ultrasound of the left breast was performed. There is an
oval circumscribed mass at 5 o'clock 2 cm from nipple measuring
x 0.3 x 0.7 cm. This corresponds with the mass seen in the lower
outer left breast at mammography. There is an oval circumscribed
hypoechoic mass at the [DATE] position 2 cm from nipple measuring
x 0.4 x 0.6 cm. This corresponds well with the mass seen in the
inner left breast at mammography. These masses demonstrate imaging
features most suggestive of benign fibroadenomas.
IMPRESSION: 1. Indeterminate mass in the right breast at the 9 o'clock position.

2. Probably benign masses in the left breast at the 5 o'clock and
[DATE] positions.

RECOMMENDATION:
1. Recommend ultrasound-guided biopsy of the mass in the right
breast at the 9 o'clock position.

2. Recommend six-month follow-up ultrasound of the probably benign
masses in the left breast. If biopsy of the right breast mass
returns as abnormal/malignancy, then recommend the patient return
for biopsy of these masses as well.

I have discussed the findings and recommendations with the patient.
If applicable, a reminder letter will be sent to the patient
regarding the next appointment.

BI-RADS CATEGORY  4: Suspicious.

## 2021-09-17 IMAGING — US US  BREAST BX W/ LOC DEV 1ST LESION IMG BX SPEC US GUIDE*R*
1 series · 10 of 10 positions shown · non-contrast
Comparison: Previous exam(s).
COMPARISON: Previous exam(s).

Addendum:
CLINICAL DATA: 34-year-old female with an indeterminate palpable
mass in the right breast at the 9 o'clock position.

EXAM:
ULTRASOUND GUIDED RIGHT BREAST CORE NEEDLE BIOPSY

[Series 1: us breast bx w/ loc dev 1st lesion img bx spec us  · 0.07mm/px · 10 of 10 slices shown]
[im 1/10]
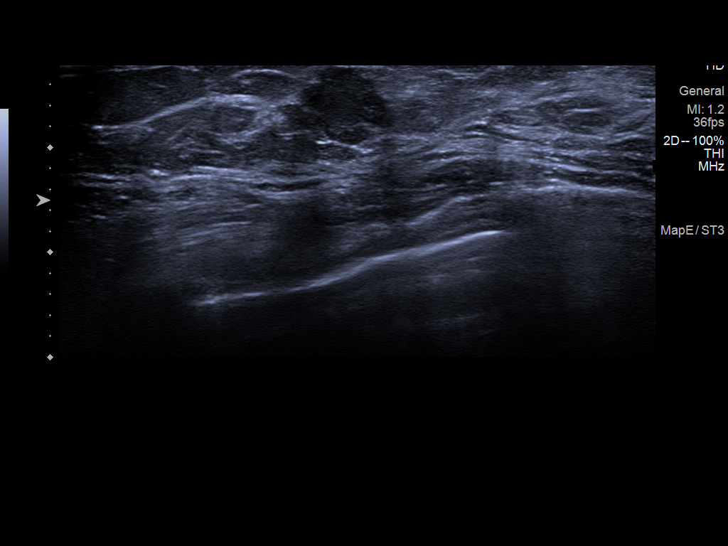
[im 2/10]
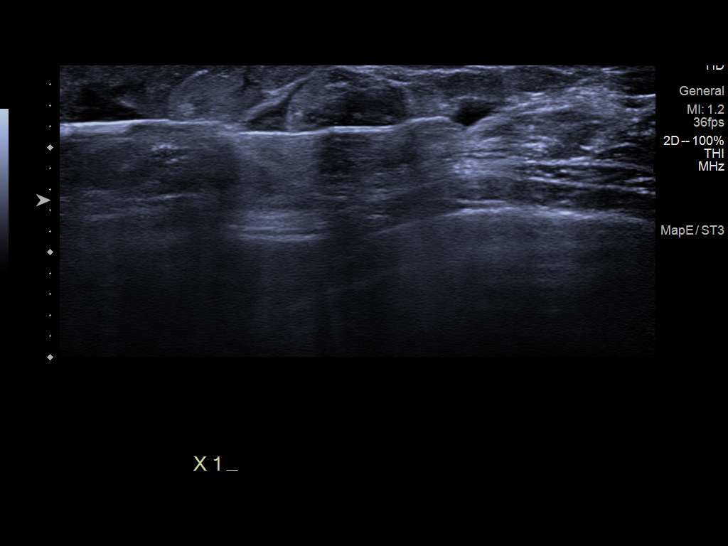
[im 3/10]
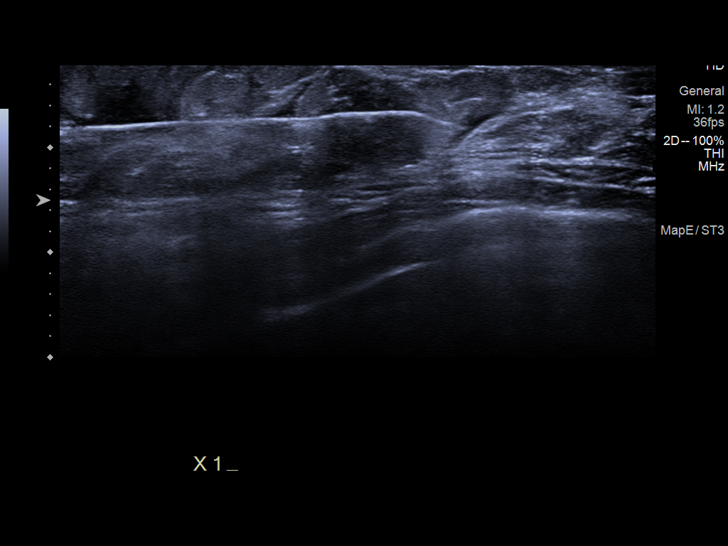
[im 4/10]
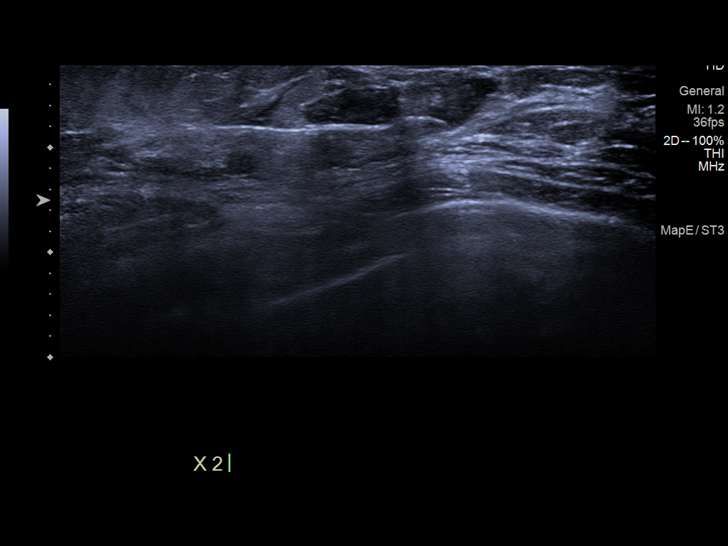
[im 5/10]
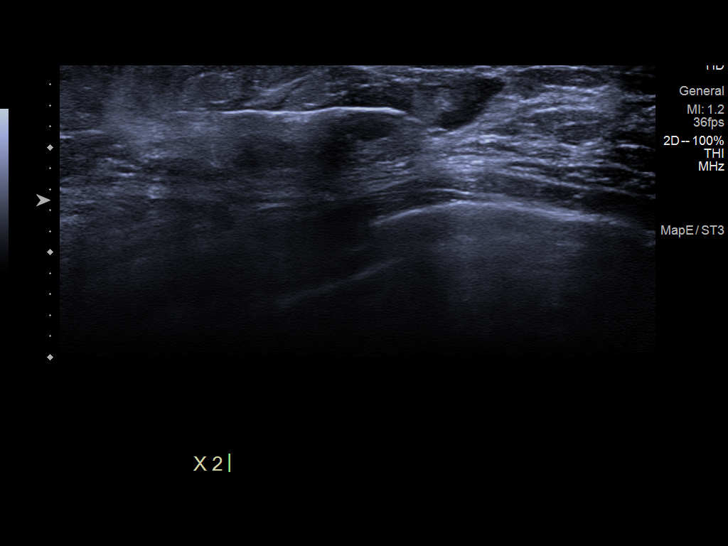
[im 6/10]
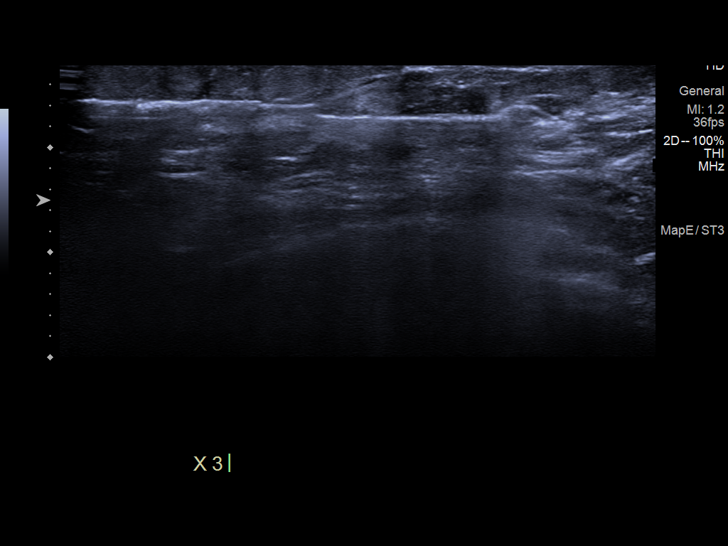
[im 7/10]
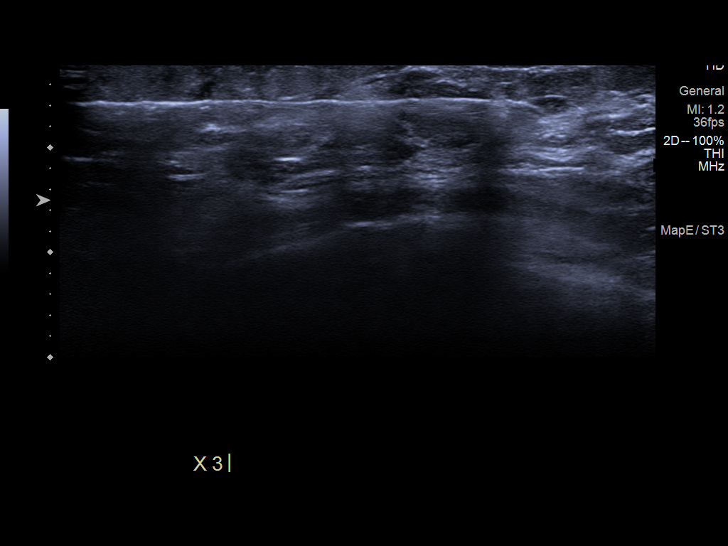
[im 8/10]
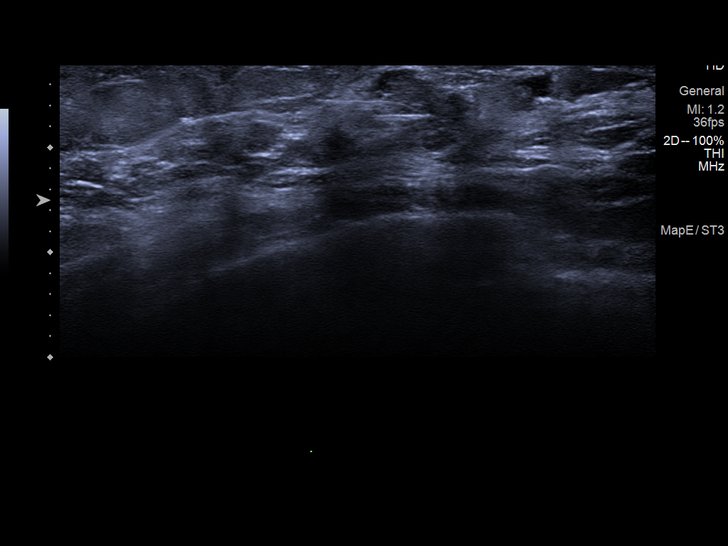
[im 9/10]
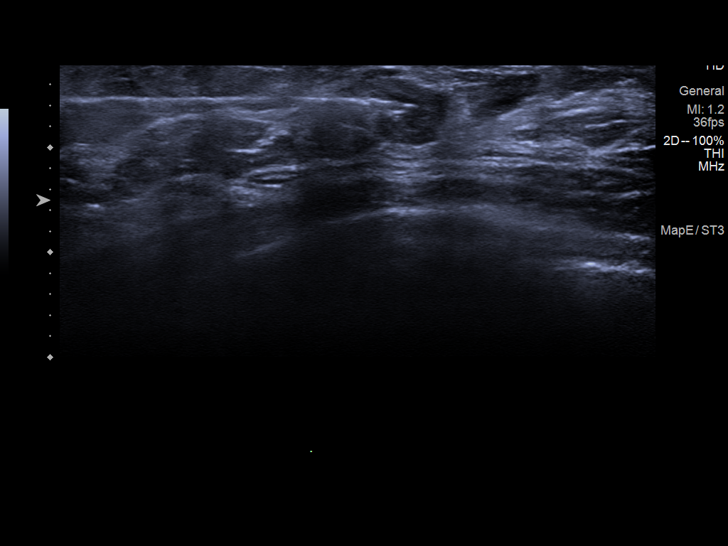
[im 10/10]
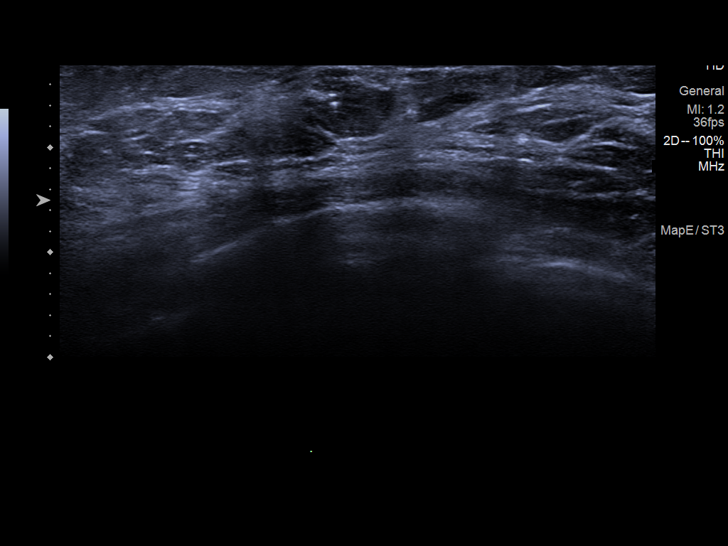

[10 of 10 positions shown; findings below may reference images not displayed]



Lesion quadrant: Upper-outer

Using sterile technique and 1% Lidocaine as local anesthetic, under
direct ultrasound visualization, a 14 gauge Broohh device was
used to perform biopsy of the mass in the right breast at the 9
o'clock position using a lateral to medial approach. At the
conclusion of the procedure a ribbon shaped tissue marker clip was
deployed into the biopsy cavity. A post biopsy mammogram was not
performed.
IMPRESSION: Ultrasound guided biopsy of the palpable mass in the right breast at
the 9 o'clock position. No apparent complications.

ADDENDUM:
PATHOLOGY revealed: A. BREAST, [DATE], RIGHT, BIOPSY: - Fibroadenoma.
- There is no evidence of malignancy.

Pathology results are CONCORDANT with imaging findings, per Dr.
Yaqui Vartanian.

Pathology results and recommendations below were discussed with
patient by telephone on 08/24/2019. Patient reported biopsy site doing
well with slight tenderness at the site. Post biopsy care
instructions were reviewed and questions were answered. Patient was
instructed to call [HOSPITAL] [HOSPITAL] Mammography
Department if any concerns or questions arise related to the biopsy.

Recommendation: Patient instructed to return in six months for LEFT
breast ultrasound for the probably benign LEFT breast masses.
Patient informed a reminder notice will be sent regarding this
appointment.

Addendum by Osa Shorts RN on 08/24/2019.



Lesion quadrant: Upper-outer

Using sterile technique and 1% Lidocaine as local anesthetic, under
direct ultrasound visualization, a 14 gauge Broohh device was
used to perform biopsy of the mass in the right breast at the 9
o'clock position using a lateral to medial approach. At the
conclusion of the procedure a ribbon shaped tissue marker clip was
deployed into the biopsy cavity. A post biopsy mammogram was not
performed.
IMPRESSION: Ultrasound guided biopsy of the palpable mass in the right breast at
the 9 o'clock position. No apparent complications.

## 2022-03-24 NOTE — L&D Delivery Note (Signed)
Betty Acosta was transferred from [facility] in [place] with PPROM this morning, arriving at approximately 10 AM. She was contracting upon admission and was 33 weeks 1 day gestation. She received one dose of BMZ at 0440 in [place].Prenatal care took place at [facility]. Upon arrival, she was found to be 3cms dilated, well effaced. She was started on magnesium sulphate, given IV antibiotics, and IV hydration. After several hours she complained of rectal pressure and was found to be 5 cms. Anethesia was quickly called and she received epidural anesthesia. She dilated to complete within the hour after epidural placement. The NICU was aware of her admission and Neonatology was also notified and updated as labor rapidly progressed  Delivery Note At  1511, a viable female was delivered vaginally over an intact perineum.  (Presentation:OA to ROT. A tight nuchal cord was noted once the head delivered, and the baby was delivered through the cord , which was quickly unwrapped once the body had delivered. The shoulder delivered quickly and with little guidance after the head delivered. The baby was immediately placed on the maternal abdomen, provided tactile stimulation and drying, and was quickly  assessed by the [facility] RN, and after one minute the cord was double clamped and cut. The baby was taken to the warmer and evaluated by Dr. Al Corpus, Neonatology.   APGAR: 8,9 ; weight  2020gms, 4 lbs 7.3 oz..   Placenta status:  ,1522  . Once assessed, the baby was taken to the NICU. Cord: 3 vessel with the following complications:  none.    Anesthesia:  Epidural Episiotomy:  none Lacerations:  none  Est. Blood Loss (mL):  50 cc  Mom to postpartum.  Baby to NICU.  Mirna Mires 10/16/2022, 3:52 PM

## 2022-04-02 ENCOUNTER — Ambulatory Visit (INDEPENDENT_AMBULATORY_CARE_PROVIDER_SITE_OTHER): Payer: Managed Care, Other (non HMO)

## 2022-04-02 VITALS — BP 124/68 | HR 46 | Ht 62.0 in | Wt 118.0 lb

## 2022-04-02 DIAGNOSIS — Z3201 Encounter for pregnancy test, result positive: Secondary | ICD-10-CM

## 2022-04-02 LAB — POCT URINE PREGNANCY: Preg Test, Ur: POSITIVE — AB

## 2022-04-02 NOTE — Progress Notes (Signed)
   NURSE VISIT- PREGNANCY CONFIRMATION   SUBJECTIVE:  Betty Acosta is a 37 y.o. G26P1001 female at 65w0dby certain LMP of Patient's last menstrual period was 02/26/2022 (exact date). Here for pregnancy confirmation.  Home pregnancy test: positive x 2   She reports no complaints.  She is taking prenatal vitamins.    OBJECTIVE:  BP 124/68 (BP Location: Right Arm, Patient Position: Sitting, Cuff Size: Normal)   Pulse (!) 46   Ht '5\' 2"'$  (1.575 m)   Wt 118 lb (53.5 kg)   LMP 02/26/2022 (Exact Date)   BMI 21.58 kg/m   Appears well, in no apparent distress  Results for orders placed or performed in visit on 04/02/22 (from the past 24 hour(s))  POCT urine pregnancy   Collection Time: 04/02/22  3:58 PM  Result Value Ref Range   Preg Test, Ur Positive (A) Negative    ASSESSMENT: Positive pregnancy test, 588w0dy LMP    PLAN: Schedule for dating ultrasound in 3 weeks Prenatal vitamins: continue   Nausea medicines: not currently needed   OB packet given: Yes  CaAndrez Grime1/12/2022 4:05 PM

## 2022-04-11 ENCOUNTER — Other Ambulatory Visit (HOSPITAL_COMMUNITY): Payer: Self-pay | Admitting: Women's Health

## 2022-04-11 DIAGNOSIS — N63 Unspecified lump in unspecified breast: Secondary | ICD-10-CM

## 2022-04-22 ENCOUNTER — Other Ambulatory Visit: Payer: Self-pay | Admitting: Obstetrics & Gynecology

## 2022-04-22 ENCOUNTER — Ambulatory Visit (HOSPITAL_COMMUNITY): Admission: RE | Admit: 2022-04-22 | Payer: Managed Care, Other (non HMO) | Source: Ambulatory Visit

## 2022-04-22 DIAGNOSIS — O3680X0 Pregnancy with inconclusive fetal viability, not applicable or unspecified: Secondary | ICD-10-CM

## 2022-04-23 ENCOUNTER — Ambulatory Visit (INDEPENDENT_AMBULATORY_CARE_PROVIDER_SITE_OTHER): Payer: Managed Care, Other (non HMO)

## 2022-04-23 DIAGNOSIS — Z3A08 8 weeks gestation of pregnancy: Secondary | ICD-10-CM

## 2022-04-23 DIAGNOSIS — O3680X0 Pregnancy with inconclusive fetal viability, not applicable or unspecified: Secondary | ICD-10-CM

## 2022-04-23 NOTE — Progress Notes (Signed)
[place] 8 wks,single IUP with yolk sac,normal ovaries,CRL 15.79 mm,FHR 167 bpm

## 2022-05-21 ENCOUNTER — Encounter: Payer: Self-pay | Admitting: Women's Health

## 2022-05-21 DIAGNOSIS — Z3490 Encounter for supervision of normal pregnancy, unspecified, unspecified trimester: Secondary | ICD-10-CM | POA: Insufficient documentation

## 2022-05-23 ENCOUNTER — Other Ambulatory Visit: Payer: Self-pay | Admitting: Obstetrics & Gynecology

## 2022-05-23 DIAGNOSIS — Z3682 Encounter for antenatal screening for nuchal translucency: Secondary | ICD-10-CM

## 2022-05-26 ENCOUNTER — Ambulatory Visit (INDEPENDENT_AMBULATORY_CARE_PROVIDER_SITE_OTHER): Payer: Managed Care, Other (non HMO)

## 2022-05-26 ENCOUNTER — Encounter: Payer: Self-pay | Admitting: Obstetrics and Gynecology

## 2022-05-26 ENCOUNTER — Ambulatory Visit (INDEPENDENT_AMBULATORY_CARE_PROVIDER_SITE_OTHER): Payer: Managed Care, Other (non HMO) | Admitting: Obstetrics and Gynecology

## 2022-05-26 ENCOUNTER — Encounter: Payer: Managed Care, Other (non HMO) | Admitting: *Deleted

## 2022-05-26 VITALS — BP 107/69 | HR 84 | Wt 120.0 lb

## 2022-05-26 DIAGNOSIS — Z3480 Encounter for supervision of other normal pregnancy, unspecified trimester: Secondary | ICD-10-CM

## 2022-05-26 DIAGNOSIS — Z3A12 12 weeks gestation of pregnancy: Secondary | ICD-10-CM | POA: Diagnosis not present

## 2022-05-26 DIAGNOSIS — Z3481 Encounter for supervision of other normal pregnancy, first trimester: Secondary | ICD-10-CM

## 2022-05-26 DIAGNOSIS — Q5122 Other partial doubling of uterus: Secondary | ICD-10-CM

## 2022-05-26 DIAGNOSIS — Z3682 Encounter for antenatal screening for nuchal translucency: Secondary | ICD-10-CM

## 2022-05-26 NOTE — Progress Notes (Signed)
Subjective:  Betty Acosta is a 37 y.o. G2P1001 at 69w5dbeing seen today for ongoing prenatal care.  She is currently monitored for the following issues for this low-risk pregnancy and has Subseptate uterus; Fibroadenoma of breast, right; and Encounter for supervision of normal pregnancy, antepartum on their problem list.  Patient reports no complaints.  Contractions: Not present. Vag. Bleeding: None.  Movement: Absent. Denies leaking of fluid.   The following portions of the patient's history were reviewed and updated as appropriate: allergies, current medications, past family history, past medical history, past social history, past surgical history and problem list. Problem list updated.  Objective:   Vitals:   05/26/22 0956  BP: 107/69  Pulse: 84  Weight: 120 lb (54.4 kg)    Fetal Status: Fetal Heart Rate (bpm): U/S   Movement: Absent     General:  Alert, oriented and cooperative. Patient is in no acute distress.  Skin: Skin is warm and dry. No rash noted.   Cardiovascular: Normal heart rate noted  Respiratory: Normal respiratory effort, no problems with respiration noted  Abdomen: Soft, gravid, appropriate for gestational age. Pain/Pressure: Absent     Pelvic:  Cervical exam deferred        Extremities: Normal range of motion.  Edema: None  Mental Status: Normal mood and affect. Normal behavior. Normal judgment and thought content.   Urinalysis:      Assessment and Plan:  Pregnancy: G2P1001 at 149w5d1. Supervision of other normal pregnancy, antepartum Prenatal care and labs reviewed with pt Genetic testing discussed - CBC/D/Plt+RPR+Rh+ABO+RubIgG... - Integrated 1 - Urine Culture - GC/Chlamydia Probe Amp  2. [redacted] weeks gestation of pregnancy  - CBC/D/Plt+RPR+Rh+ABO+RubIgG... - Integrated 1 - Urine Culture - GC/Chlamydia Probe Amp  3. Encounter for (NT) nuchal translucency scan  - Integrated 1  Preterm labor symptoms and general obstetric precautions including but  not limited to vaginal bleeding, contractions, leaking of fluid and fetal movement were reviewed in detail with the patient. Please refer to After Visit Summary for other counseling recommendations.  Return in about 4 weeks (around 06/23/2022) for OB visit, face to face, any provider.   ErChancy MilroyMD

## 2022-05-26 NOTE — Progress Notes (Signed)
[place] 12+5 wks,measurements c/w dates,FHR 169 bpm,normal ovaries,right lateral placenta,NB present,NT 1.5 mm,CRL 68.69 mm

## 2022-05-28 LAB — URINE CULTURE

## 2022-05-29 LAB — CBC/D/PLT+RPR+RH+ABO+RUBIGG...
Antibody Screen: NEGATIVE
Basophils Absolute: 0 10*3/uL (ref 0.0–0.2)
Basos: 0 %
EOS (ABSOLUTE): 0 10*3/uL (ref 0.0–0.4)
Eos: 0 %
HCV Ab: NONREACTIVE
HIV Screen 4th Generation wRfx: NONREACTIVE
Hematocrit: 40.5 % (ref 34.0–46.6)
Hemoglobin: 13.5 g/dL (ref 11.1–15.9)
Hepatitis B Surface Ag: NEGATIVE
Immature Grans (Abs): 0.1 10*3/uL (ref 0.0–0.1)
Immature Granulocytes: 1 %
Lymphocytes Absolute: 1.1 10*3/uL (ref 0.7–3.1)
Lymphs: 19 %
MCH: 30.9 pg (ref 26.6–33.0)
MCHC: 33.3 g/dL (ref 31.5–35.7)
MCV: 93 fL (ref 79–97)
Monocytes Absolute: 0.5 10*3/uL (ref 0.1–0.9)
Monocytes: 8 %
Neutrophils Absolute: 4.3 10*3/uL (ref 1.4–7.0)
Neutrophils: 72 %
Platelets: 227 10*3/uL (ref 150–450)
RBC: 4.37 x10E6/uL (ref 3.77–5.28)
RDW: 12.6 % (ref 11.7–15.4)
RPR Ser Ql: NONREACTIVE
Rh Factor: POSITIVE
Rubella Antibodies, IGG: 3.94 index (ref >0.99)
WBC: 6 10*3/uL (ref 3.4–10.8)

## 2022-05-29 LAB — INTEGRATED 1
Crown Rump Length: 68.7 mm
Gest. Age on Collection Date: 12.9 weeks
Maternal Age at EDD: 35.5 yr
Nuchal Translucency (NT): 1.5 mm
Number of Fetuses: 1
PAPP-A Value: 1012 ng/mL

## 2022-05-29 LAB — GC/CHLAMYDIA PROBE AMP
Chlamydia trachomatis, NAA: NEGATIVE
Neisseria Gonorrhoeae by PCR: NEGATIVE

## 2022-05-29 LAB — HCV INTERPRETATION

## 2022-06-02 LAB — PANORAMA PRENATAL TEST FULL PANEL:PANORAMA TEST PLUS 5 ADDITIONAL MICRODELETIONS: FETAL FRACTION: 11.7

## 2022-06-17 LAB — HORIZON CUSTOM: REPORT SUMMARY: POSITIVE — AB

## 2022-06-23 ENCOUNTER — Encounter: Payer: Managed Care, Other (non HMO) | Admitting: Advanced Practice Midwife

## 2022-07-01 ENCOUNTER — Encounter: Payer: Self-pay | Admitting: Women's Health

## 2022-07-01 ENCOUNTER — Telehealth (INDEPENDENT_AMBULATORY_CARE_PROVIDER_SITE_OTHER): Payer: Managed Care, Other (non HMO) | Admitting: Women's Health

## 2022-07-01 VITALS — BP 99/60 | HR 65

## 2022-07-01 DIAGNOSIS — O285 Abnormal chromosomal and genetic finding on antenatal screening of mother: Secondary | ICD-10-CM

## 2022-07-01 DIAGNOSIS — Z363 Encounter for antenatal screening for malformations: Secondary | ICD-10-CM

## 2022-07-01 DIAGNOSIS — Z3482 Encounter for supervision of other normal pregnancy, second trimester: Secondary | ICD-10-CM

## 2022-07-01 DIAGNOSIS — Z3480 Encounter for supervision of other normal pregnancy, unspecified trimester: Secondary | ICD-10-CM

## 2022-07-01 DIAGNOSIS — Z3A17 17 weeks gestation of pregnancy: Secondary | ICD-10-CM

## 2022-07-01 NOTE — Patient Instructions (Signed)
Betty Acosta, thank you for choosing our office today! We appreciate the opportunity to meet your healthcare needs. You may receive a short survey by mail, e-mail, or through MyChart. If you are happy with your care we would appreciate if you could take just a few minutes to complete the survey questions. We read all of your comments and take your feedback very seriously. Thank you again for choosing our office.  [facility] Team at [facility] ([address] [place], [place] [address]) Entrance C, located off of [address] Free 24/7 valet parking  Go to [email or web address] to register for FREE online childbirth classes  Call the office ([phone]) or go to [facility] if: You begin to severe cramping Your water breaks.  Sometimes it is a big gush of fluid, sometimes it is just a trickle that keeps getting your panties wet or running down your legs You have vaginal bleeding.  It is normal to have a small amount of spotting if your cervix was checked.   [place] Pediatric[facility]/Family Doctors [facility] ([facility]): [address], [phone]           [facility]: [address], [phone]                [facility] ([facility]): [address], [phone] (call to ask if accepting patients) [facility]: [address], [place], [phone]    [place] Pediatricians/Family Doctors [facility] ([facility]): [address], [phone] [facility]: [address], [phone] [facility]: [address], [phone]  [place] [facility]  [facility] ([facility]): [phone] [facility]: [address], [phone]   [place] [facility]: [address], [phone]  [place] [facility]  [facility]: [address], [phone]  Home Blood Pressure Monitoring for Patients   Your provider has recommended that you check your blood pressure (BP) at least once a week at home. If you do not have a blood pressure cuff at home, one will be provided for you. Contact your provider if you have not received your monitor within 1 week.   Helpful Tips for Accurate Home Blood Pressure Checks  Don't smoke, exercise, or drink caffeine 30 minutes before checking your BP Use the restroom before checking your BP (a full bladder can raise your pressure) Relax in a comfortable upright chair Feet on the ground Left arm resting comfortably on a flat surface at the level of your heart Legs uncrossed Back supported Sit quietly and don't talk Place the cuff on your bare arm Adjust snuggly, so that only two fingertips can fit between your skin and the top of the cuff Check 2 readings separated by at least one minute Keep a log of your BP readings For a visual, please reference this diagram: http://ccnc.care/bpdiagram  Provider Name: [facility]     Phone: [phone]  Zone 1: ALL CLEAR  Continue to monitor your symptoms:  BP reading is less than 140 (top number) or less than 90 (bottom number)  No right upper stomach pain No headaches or seeing spots No feeling nauseated or throwing up No swelling in face and hands  Zone 2: CAUTION Call your doctor's office for any of the following:  BP reading is greater than 140 (top number) or greater than   90 (bottom number)  Stomach pain under your ribs in the middle or right side Headaches or seeing spots Feeling nauseated or throwing up Swelling in face and hands  Zone 3: EMERGENCY  Seek immediate medical care if you have any of the following:  BP reading is greater than160 (top number) or greater than 110 (bottom number) Severe headaches not improving with Tylenol Serious difficulty catching your breath Any worsening symptoms from  Zone 2     Second Trimester of Pregnancy The second trimester is from week 14 through week 27 (months 4 through 6). The second trimester is often a time when you feel your best. Your body has adjusted to being pregnant, and you begin to feel better physically. Usually, morning sickness has lessened or quit completely, you may have more energy, and you may have an increase in appetite. The second trimester is also a time when the fetus is growing rapidly. At the end of the sixth month, the fetus is about 9 inches long and weighs about 1 pounds. You will likely begin to feel the baby move (quickening) between 16 and 20 weeks of pregnancy. Body changes during your second trimester Your body continues to go through many changes during your second trimester. The changes vary from woman to woman. Your weight will continue to increase. You will notice your lower abdomen bulging out. You may begin to get stretch marks on your hips, abdomen, and breasts. You may develop headaches that can be relieved by medicines. The medicines should be approved by your health care provider. You may urinate more often because the fetus is pressing on your bladder. You may develop or continue to have heartburn as a result of your pregnancy. You may develop constipation because certain hormones are causing the muscles that push waste through your intestines to slow down. You may develop hemorrhoids or swollen, bulging veins (varicose veins). You may have back pain. This is caused by: Weight gain. Pregnancy hormones that are relaxing the joints in your pelvis. A shift in weight and the muscles that support your balance. Your breasts will continue to grow and they will continue to become tender. Your gums may bleed and may be sensitive to brushing and flossing. Dark spots or blotches (chloasma, mask of pregnancy) may develop on your face. This will likely fade after the baby is born. A dark line from your belly button to  the pubic area (linea nigra) may appear. This will likely fade after the baby is born. You may have changes in your hair. These can include thickening of your hair, rapid growth, and changes in texture. Some women also have hair loss during or after pregnancy, or hair that feels dry or thin. Your hair will most likely return to normal after your baby is born.  What to expect at prenatal visits During a routine prenatal visit: You will be weighed to make sure you and the fetus are growing normally. Your blood pressure will be taken. Your abdomen will be measured to track your baby's growth. The fetal heartbeat will be listened to. Any test results from the previous visit will be discussed.  Your health care provider may ask you: How you are feeling. If you are feeling the baby move. If you have had any abnormal symptoms, such as leaking fluid, bleeding, severe headaches, or abdominal cramping. If you are using any tobacco products, including cigarettes, chewing tobacco, and electronic cigarettes. If you have any questions.  Other tests that may be performed during   your second trimester include: Blood tests that check for: Low iron levels (anemia). High blood sugar that affects pregnant women (gestational diabetes) between 24 and 28 weeks. Rh antibodies. This is to check for a protein on red blood cells (Rh factor). Urine tests to check for infections, diabetes, or protein in the urine. An ultrasound to confirm the proper growth and development of the baby. An amniocentesis to check for possible genetic problems. Fetal screens for spina bifida and Down syndrome. HIV (human immunodeficiency virus) testing. Routine prenatal testing includes screening for HIV, unless you choose not to have this test.  Follow these instructions at home: Medicines Follow your health care provider's instructions regarding medicine use. Specific medicines may be either safe or unsafe to take during  pregnancy. Take a prenatal vitamin that contains at least 600 micrograms (mcg) of folic acid. If you develop constipation, try taking a stool softener if your health care provider approves. Eating and drinking Eat a balanced diet that includes fresh fruits and vegetables, whole grains, good sources of protein such as meat, eggs, or tofu, and low-fat dairy. Your health care provider will help you determine the amount of weight gain that is right for you. Avoid raw meat and uncooked cheese. These carry germs that can cause birth defects in the baby. If you have low calcium intake from food, talk to your health care provider about whether you should take a daily calcium supplement. Limit foods that are high in fat and processed sugars, such as fried and sweet foods. To prevent constipation: Drink enough fluid to keep your urine clear or pale yellow. Eat foods that are high in fiber, such as fresh fruits and vegetables, whole grains, and beans. Activity Exercise only as directed by your health care provider. Most women can continue their usual exercise routine during pregnancy. Try to exercise for 30 minutes at least 5 days a week. Stop exercising if you experience uterine contractions. Avoid heavy lifting, wear low heel shoes, and practice good posture. A sexual relationship may be continued unless your health care provider directs you otherwise. Relieving pain and discomfort Wear a good support bra to prevent discomfort from breast tenderness. Take warm sitz baths to soothe any pain or discomfort caused by hemorrhoids. Use hemorrhoid cream if your health care provider approves. Rest with your legs elevated if you have leg cramps or low back pain. If you develop varicose veins, wear support hose. Elevate your feet for 15 minutes, 3-4 times a day. Limit salt in your diet. Prenatal Care Write down your questions. Take them to your prenatal visits. Keep all your prenatal visits as told by your health  care provider. This is important. Safety Wear your seat belt at all times when driving. Make a list of emergency phone numbers, including numbers for family, friends, the hospital, and police and fire departments. General instructions Ask your health care provider for a referral to a local prenatal education class. Begin classes no later than the beginning of month 6 of your pregnancy. Ask for help if you have counseling or nutritional needs during pregnancy. Your health care provider can offer advice or refer you to specialists for help with various needs. Do not use hot tubs, steam rooms, or saunas. Do not douche or use tampons or scented sanitary pads. Do not cross your legs for long periods of time. Avoid cat litter boxes and soil used by cats. These carry germs that can cause birth defects in the baby and possibly loss of the   fetus by miscarriage or stillbirth. Avoid all smoking, herbs, alcohol, and unprescribed drugs. Chemicals in these products can affect the formation and growth of the baby. Do not use any products that contain nicotine or tobacco, such as cigarettes and e-cigarettes. If you need help quitting, ask your health care provider. Visit your dentist if you have not gone yet during your pregnancy. Use a soft toothbrush to brush your teeth and be gentle when you floss. Contact a health care provider if: You have dizziness. You have mild pelvic cramps, pelvic pressure, or nagging pain in the abdominal area. You have persistent nausea, vomiting, or diarrhea. You have a bad smelling vaginal discharge. You have pain when you urinate. Get help right away if: You have a fever. You are leaking fluid from your vagina. You have spotting or bleeding from your vagina. You have severe abdominal cramping or pain. You have rapid weight gain or weight loss. You have shortness of breath with chest pain. You notice sudden or extreme swelling of your face, hands, ankles, feet, or legs. You  have not felt your baby move in over an hour. You have severe headaches that do not go away when you take medicine. You have vision changes. Summary The second trimester is from week 14 through week 27 (months 4 through 6). It is also a time when the fetus is growing rapidly. Your body goes through many changes during pregnancy. The changes vary from woman to woman. Avoid all smoking, herbs, alcohol, and unprescribed drugs. These chemicals affect the formation and growth your baby. Do not use any tobacco products, such as cigarettes, chewing tobacco, and e-cigarettes. If you need help quitting, ask your health care provider. Contact your health care provider if you have any questions. Keep all prenatal visits as told by your health care provider. This is important. This information is not intended to replace advice given to you by your health care provider. Make sure you discuss any questions you have with your health care provider. Document Released: 03/04/2001 Document Revised: 08/16/2015 Document Reviewed: 05/11/2012 Elsevier Interactive Patient Education  2017 Elsevier Inc.  

## 2022-07-01 NOTE — Progress Notes (Signed)
TELEHEALTH VIRTUAL OBSTETRICS VISIT ENCOUNTER NOTE Patient name: Betty Acosta MRN [id number]  Date of birth: 1985-11-19  I connected with patient on 07/01/22 at 11:10 AM EDT by MyChart video  and verified that I am speaking with the correct person using two identifiers. Pt is not currently in our office, she is at home.  The provider is in the office.    I discussed the limitations, risks, security and privacy concerns of performing an evaluation and management service by telephone and the availability of in person appointments. I also discussed with the patient that there may be a patient responsible charge related to this service. The patient expressed understanding and agreed to proceed.  Chief Complaint:   Routine Prenatal Visit  History of Present Illness:   Betty Acosta is a 37 y.o. G55P1001 female at [redacted]w[redacted]d with an Estimated Date of Delivery: 12/03/22 being evaluated today for ongoing management of a low-risk pregnancy.     05/26/2022    9:58 AM 07/20/2019   11:44 AM 09/21/2017   11:25 AM 01/07/2017    1:53 PM 12/11/2016    1:50 PM  Depression screen PHQ 2/9  Decreased Interest 0 0 0 0 0  Down, Depressed, Hopeless 0 0 0 0 0  PHQ - 2 Score 0 0 0 0 0  Altered sleeping 1 0  1   Tired, decreased energy 2 1  2    Change in appetite 2 0  0   Feeling bad or failure about yourself  0 0  0   Trouble concentrating 1 0  0   Moving slowly or fidgety/restless 0 0  0   Suicidal thoughts 0 0  0   PHQ-9 Score 6 1  3    Difficult doing work/chores Not difficult at all Not difficult at all  Not difficult at all     Today she reports no complaints. Wants to defer pap til pp.  Contractions: Not present.  .  Movement: Present. denies leaking of fluid. Review of Systems:   Pertinent items are noted in HPI Denies abnormal vaginal discharge w/ itching/odor/irritation, headaches, visual changes, shortness of breath, chest pain, abdominal pain, severe nausea/vomiting, or problems with urination or bowel  movements unless otherwise stated above. Pertinent History Reviewed:  Reviewed past medical,surgical, social, obstetrical and family history.  Reviewed problem list, medications and allergies. Physical Assessment:   Vitals:   07/01/22 1131  BP: 99/60  Pulse: 65  There is no height or weight on file to calculate BMI.        Physical Examination:   General:  Alert, oriented and cooperative.   Mental Status: Normal mood and affect perceived. Normal judgment and thought content.  Rest of physical exam deferred due to type of encounter  No results found for this or any previous visit (from the past 24 hour(s)).  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  Labs/procedures today: none  Plan:  Continue routine obstetrical care.  Does have home bp cuff. Office bp cuff given: not applicable. Check bp weekly, let us know if consistently >140 and/or >90.  Next visit: prefers will be in person for u/s     Reviewed: Preterm labor symptoms and general obstetric precautions including but  not limited to vaginal bleeding, contractions, leaking of fluid and fetal movement were reviewed in detail with the patient. The patient was advised to call back or seek an in-person office evaluation/[facility] for any urgent or concerning symptoms. All questions were answered. Please refer to After Visit Summary for other counseling recommendations.    I provided 10 minutes of non-face-to-face time during this encounter.  Follow-up: Return in about 2 weeks (around 07/15/2022) for LROB, IY:MEBRAXE, 2nd IT in person CNM.  Orders Placed This Encounter  Procedures   US OB Comp + [address]   Cheral Marker CNM, [facility] 07/01/2022 12:05 PM

## 2022-07-17 ENCOUNTER — Other Ambulatory Visit: Payer: Managed Care, Other (non HMO)

## 2022-07-17 ENCOUNTER — Encounter: Payer: Managed Care, Other (non HMO) | Admitting: Advanced Practice Midwife

## 2022-07-18 ENCOUNTER — Encounter: Payer: Self-pay | Admitting: Women's Health

## 2022-07-22 ENCOUNTER — Encounter: Payer: Managed Care, Other (non HMO) | Admitting: Women's Health

## 2022-07-22 ENCOUNTER — Ambulatory Visit (INDEPENDENT_AMBULATORY_CARE_PROVIDER_SITE_OTHER): Payer: Managed Care, Other (non HMO)

## 2022-07-22 DIAGNOSIS — Z363 Encounter for antenatal screening for malformations: Secondary | ICD-10-CM

## 2022-07-22 DIAGNOSIS — Z3A20 20 weeks gestation of pregnancy: Secondary | ICD-10-CM

## 2022-07-22 DIAGNOSIS — Z3480 Encounter for supervision of other normal pregnancy, unspecified trimester: Secondary | ICD-10-CM

## 2022-07-22 DIAGNOSIS — Z3482 Encounter for supervision of other normal pregnancy, second trimester: Secondary | ICD-10-CM | POA: Diagnosis not present

## 2022-07-22 NOTE — Progress Notes (Signed)
[place] 20+6 wks,cephalic,CX  3.6 cm,right lateral placenta gr 0,normal ovaries,FHR 152 bpm,SVP of fluid 7.3 cm,EFW 461 g 92%,anatomy complete

## 2022-07-23 ENCOUNTER — Telehealth: Payer: Managed Care, Other (non HMO) | Admitting: Obstetrics & Gynecology

## 2022-07-23 ENCOUNTER — Encounter: Payer: Self-pay | Admitting: Obstetrics & Gynecology

## 2022-07-23 VITALS — BP 98/54 | HR 69

## 2022-07-23 DIAGNOSIS — Z3482 Encounter for supervision of other normal pregnancy, second trimester: Secondary | ICD-10-CM

## 2022-07-23 DIAGNOSIS — Z3A21 21 weeks gestation of pregnancy: Secondary | ICD-10-CM

## 2022-07-23 DIAGNOSIS — Z3480 Encounter for supervision of other normal pregnancy, unspecified trimester: Secondary | ICD-10-CM

## 2022-07-23 NOTE — Progress Notes (Signed)
TELEHEALTH VIRTUAL OBSTETRICS VISIT ENCOUNTER NOTE Patient name: Betty Acosta MRN [id number]  Date of birth: 09-04-1985  I connected with patient on 07/23/22 at 10:10 AM EDT by video and verified that I am speaking with the correct person using two identifiers. Pt is not currently in our office, she is at home.  The provider is in the office.    I discussed the limitations, risks, security and privacy concerns of performing an evaluation and management service by telephone and the availability of in person appointments. I also discussed with the patient that there may be a patient responsible charge related to this service. The patient expressed understanding and agreed to proceed.  Chief Complaint:   Routine Prenatal Visit  History of Present Illness:   Betty Acosta is a 37 y.o. G90P1001 female at [redacted]w[redacted]d with an Estimated Date of Delivery: 12/03/22 being evaluated today for ongoing management of a low-risk pregnancy.     05/26/2022    9:58 AM 07/20/2019   11:44 AM 09/21/2017   11:25 AM 01/07/2017    1:53 PM 12/11/2016    1:50 PM  Depression screen PHQ 2/9  Decreased Interest 0 0 0 0 0  Down, Depressed, Hopeless 0 0 0 0 0  PHQ - 2 Score 0 0 0 0 0  Altered sleeping 1 0  1   Tired, decreased energy 2 1  2    Change in appetite 2 0  0   Feeling bad or failure about yourself  0 0  0   Trouble concentrating 1 0  0   Moving slowly or fidgety/restless 0 0  0   Suicidal thoughts 0 0  0   PHQ-9 Score 6 1  3    Difficult doing work/chores Not difficult at all Not difficult at all  Not difficult at all     Today she reports  mild headaches.  Denies blurry vision.  Notes normal blood pressures . Contractions: Not present. Vag. Bleeding: None.  Movement: Present. denies leaking of fluid. Review of Systems:   Pertinent items are noted in HPI Denies abnormal vaginal discharge w/ itching/odor/irritation, shortness of breath, chest pain, abdominal pain, severe nausea/vomiting, or problems with urination or  bowel movements unless otherwise stated above. Pertinent History Reviewed:  Reviewed past medical,surgical, social, obstetrical and family history.  Reviewed problem list, medications and allergies. Physical Assessment:   Vitals:   07/23/22 0957  BP: (!) 98/54  Pulse: 69  There is no height or weight on file to calculate BMI.        Physical Examination:   General:  Alert, oriented and cooperative.   Mental Status: Normal mood and affect perceived. Normal judgment and thought content.  Rest of physical exam deferred due to type of encounter  No results found for this or any previous visit (from the past 24 hour(s)).  Assessment & Plan:  1) Pregnancy G2P1001 at [redacted]w[redacted]d with an Estimated Date of Delivery: 12/03/22   -Reviewed yesterday's anatomy scan, AGA -Continue routine OB care   Meds: No orders of the defined types were placed in this encounter.   Labs/procedures today: Anatomy scan done yesterday  Plan:  Continue routine obstetrical care .  Does have home bp cuff  Next visit: prefers  either in person or video     Reviewed: Preterm labor symptoms and general obstetric precautions including but not limited to vaginal bleeding, contractions, leaking of fluid and fetal movement were reviewed in detail with the patient. The patient was advised to call  back or seek an in-person office evaluation/go to [facility] at [facility] for any urgent or concerning symptoms. All questions were answered. Please refer to After Visit Summary for other counseling recommendations.    I provided 10 minutes of non-face-to-face time during this encounter, which including reviewing the chart, talking to patient and documentation.  Follow-up: Return in about 4 weeks (around 08/20/2022) for LROB visit.  No orders of the defined types were placed in this encounter.  Myna Hidalgo, DO Attending Obstetrician & Gynecologist, [facility] for [employer], [facility]

## 2022-07-25 ENCOUNTER — Encounter: Payer: Self-pay | Admitting: Women's Health

## 2022-07-28 ENCOUNTER — Telehealth: Payer: Self-pay | Admitting: Women's Health

## 2022-07-28 NOTE — Telephone Encounter (Signed)
Started taking message, but sent to a nurse.

## 2022-08-29 ENCOUNTER — Ambulatory Visit (INDEPENDENT_AMBULATORY_CARE_PROVIDER_SITE_OTHER): Payer: Managed Care, Other (non HMO) | Admitting: Family Medicine

## 2022-08-29 ENCOUNTER — Encounter: Payer: Self-pay | Admitting: Family Medicine

## 2022-08-29 VITALS — BP 105/70 | HR 75 | Wt 132.0 lb

## 2022-08-29 DIAGNOSIS — O285 Abnormal chromosomal and genetic finding on antenatal screening of mother: Secondary | ICD-10-CM

## 2022-08-29 DIAGNOSIS — O26892 Other specified pregnancy related conditions, second trimester: Secondary | ICD-10-CM

## 2022-08-29 DIAGNOSIS — R102 Pelvic and perineal pain: Secondary | ICD-10-CM

## 2022-08-29 DIAGNOSIS — O26899 Other specified pregnancy related conditions, unspecified trimester: Secondary | ICD-10-CM

## 2022-08-29 DIAGNOSIS — R079 Chest pain, unspecified: Secondary | ICD-10-CM

## 2022-08-29 DIAGNOSIS — Z3A26 26 weeks gestation of pregnancy: Secondary | ICD-10-CM

## 2022-08-29 DIAGNOSIS — Z3480 Encounter for supervision of other normal pregnancy, unspecified trimester: Secondary | ICD-10-CM

## 2022-08-29 NOTE — Progress Notes (Signed)
   PRENATAL VISIT NOTE  Subjective:  Betty Acosta is a 37 y.o. G2P1001 at [redacted]w[redacted]d being seen today for ongoing prenatal care.  She is currently monitored for the following issues for this low-risk pregnancy and has Subseptate uterus; Fibroadenoma of breast, right; Encounter for supervision of normal pregnancy, antepartum; and Abnormal chromosomal and genetic finding on antenatal screening mother on their problem list.  Patient reports  1. chest pain x2 days lasting for 5 minutes which last occurred yesterday. No prior hx of chest pain. Hx of asthma and anxiety. No other associated symptoms. 2. Feels like something is bulging out of her vagina when she coughs.  .  Contractions: Irritability. Vag. Bleeding: None.  Movement: Present. Denies leaking of fluid.   The following portions of the patient's history were reviewed and updated as appropriate: allergies, current medications, past family history, past medical history, past social history, past surgical history and problem list.   Objective:   Vitals:   08/29/22 1029  BP: 105/70  Pulse: 75  Weight: 132 lb (59.9 kg)   Fetal Status: Fetal Heart Rate (bpm): 152 Fundal Height: 26 cm Movement: Present     General:  Alert, oriented and cooperative. Patient is in no acute distress.  Skin: Skin is warm and dry. No rash noted.   Cardiovascular: Normal heart rate noted  Respiratory: Normal respiratory effort, no problems with respiration noted  Abdomen: Soft, gravid, appropriate for gestational age.  Pain/Pressure: Absent     Pelvic: Cervical exam performed in the presence of a chaperone       No sign of prolapse, however I do appreciate a less tone pelvic floor with coughing.  Extremities: Normal range of motion.     Mental Status: Normal mood and affect. Normal behavior. Normal judgment and thought content.   Assessment and Plan:  Pregnancy: G2P1001 at [redacted]w[redacted]d 1. Supervision of other normal pregnancy, antepartum See acute concerns below.  2.  Abnormal chromosomal and genetic finding on antenatal screening mother SMA, CF carrier. Partner testing pending. US wnl.   3. [redacted] weeks gestation of pregnancy Needs gtt at follow up in 2 weeks.   4. Chest pain, unspecified type X2 days. Last episode yesterday. No current chest pain or other associated symptoms. Normal heart sounds gave return and ED precaution. Would consider EKG, office unable to perform at this time and patient made aware and voiced understanding of need to follow up with reoccurrence.   5. Pelvic pressure in pregnancy No evidence of prolapse. Likely weight from growing uterus but could possible benefit from pelvic floor strengthening prior to delivery to increase tone of pelvic floor. Can also at home kegels if unable to do therapy due to life obligations.  - Ambulatory referral to Physical Therapy   Preterm labor symptoms and general obstetric precautions including but not limited to vaginal bleeding, contractions, leaking of fluid and fetal movement were reviewed in detail with the patient. Please refer to After Visit Summary for other counseling recommendations.   No follow-ups on file.  No future appointments.   Shamonique Battiste Autry-Lott, DO

## 2022-09-11 ENCOUNTER — Encounter: Payer: Self-pay | Admitting: Women's Health

## 2022-09-19 ENCOUNTER — Other Ambulatory Visit: Payer: Managed Care, Other (non HMO)

## 2022-09-19 ENCOUNTER — Ambulatory Visit: Payer: Managed Care, Other (non HMO) | Admitting: Obstetrics and Gynecology

## 2022-09-19 ENCOUNTER — Encounter: Payer: Self-pay | Admitting: Obstetrics and Gynecology

## 2022-09-19 VITALS — BP 113/67 | HR 80 | Wt 135.0 lb

## 2022-09-19 DIAGNOSIS — Z131 Encounter for screening for diabetes mellitus: Secondary | ICD-10-CM

## 2022-09-19 DIAGNOSIS — Z3A28 28 weeks gestation of pregnancy: Secondary | ICD-10-CM

## 2022-09-19 DIAGNOSIS — Z3480 Encounter for supervision of other normal pregnancy, unspecified trimester: Secondary | ICD-10-CM

## 2022-09-19 DIAGNOSIS — O285 Abnormal chromosomal and genetic finding on antenatal screening of mother: Secondary | ICD-10-CM

## 2022-09-19 DIAGNOSIS — Z3A29 29 weeks gestation of pregnancy: Secondary | ICD-10-CM

## 2022-09-19 NOTE — Progress Notes (Signed)
Subjective:  Betty Acosta is a 37 y.o. G2P1001 at [redacted]w[redacted]d being seen today for ongoing prenatal care.  She is currently monitored for the following issues for this low-risk pregnancy and has Subseptate uterus; Fibroadenoma of breast, right; Encounter for supervision of normal pregnancy, antepartum; and Abnormal chromosomal and genetic finding on antenatal screening mother on their problem list.  Patient reports  general discomforts of pregnancy .  Contractions: Not present.  .  Movement: Present. Denies leaking of fluid.   The following portions of the patient's history were reviewed and updated as appropriate: allergies, current medications, past family history, past medical history, past social history, past surgical history and problem list. Problem list updated.  Objective:   Vitals:   09/19/22 0931  BP: 113/67  Pulse: 80  Weight: 135 lb (61.2 kg)    Fetal Status:     Movement: Present     General:  Alert, oriented and cooperative. Patient is in no acute distress.  Skin: Skin is warm and dry. No rash noted.   Cardiovascular: Normal heart rate noted  Respiratory: Normal respiratory effort, no problems with respiration noted  Abdomen: Soft, gravid, appropriate for gestational age. Pain/Pressure: Absent     Pelvic:  Cervical exam deferred        Extremities: Normal range of motion.     Mental Status: Normal mood and affect. Normal behavior. Normal judgment and thought content.   Urinalysis:      Assessment and Plan:  Pregnancy: G2P1001 at [redacted]w[redacted]d  1. Supervision of other normal pregnancy, antepartum Stable Glucola today  2. Abnormal chromosomal and genetic finding on antenatal screening mother Normal U/S. Partner testing requested  Preterm labor symptoms and general obstetric precautions including but not limited to vaginal bleeding, contractions, leaking of fluid and fetal movement were reviewed in detail with the patient. Please refer to After Visit Summary for other counseling  recommendations.  Return in about 2 weeks (around 10/03/2022) for OB visit, face to face, any provider.   Hermina Staggers, MD

## 2022-09-20 LAB — CBC
Hematocrit: 31.8 % — ABNORMAL LOW (ref 34.0–46.6)
Hemoglobin: 10.3 g/dL — ABNORMAL LOW (ref 11.1–15.9)
MCH: 29.7 pg (ref 26.6–33.0)
MCHC: 32.4 g/dL (ref 31.5–35.7)
MCV: 92 fL (ref 79–97)
Platelets: 238 10*3/uL (ref 150–450)
RBC: 3.47 x10E6/uL — ABNORMAL LOW (ref 3.77–5.28)
RDW: 12.8 % (ref 11.7–15.4)
WBC: 7.3 10*3/uL (ref 3.4–10.8)

## 2022-09-20 LAB — GLUCOSE TOLERANCE, 2 HOURS W/ 1HR
Glucose, 1 hour: 197 mg/dL — ABNORMAL HIGH (ref 70–179)
Glucose, 2 hour: 109 mg/dL (ref 70–152)
Glucose, Fasting: 78 mg/dL (ref 70–91)

## 2022-09-20 LAB — ANTIBODY SCREEN: Antibody Screen: NEGATIVE

## 2022-09-20 LAB — RPR: RPR Ser Ql: NONREACTIVE

## 2022-09-20 LAB — HIV ANTIBODY (ROUTINE TESTING W REFLEX): HIV Screen 4th Generation wRfx: NONREACTIVE

## 2022-09-26 ENCOUNTER — Encounter: Payer: Self-pay | Admitting: Obstetrics and Gynecology

## 2022-09-26 ENCOUNTER — Encounter: Payer: Self-pay | Admitting: *Deleted

## 2022-09-26 ENCOUNTER — Other Ambulatory Visit: Payer: Self-pay | Admitting: *Deleted

## 2022-09-26 DIAGNOSIS — O24410 Gestational diabetes mellitus in pregnancy, diet controlled: Secondary | ICD-10-CM

## 2022-09-26 DIAGNOSIS — O24419 Gestational diabetes mellitus in pregnancy, unspecified control: Secondary | ICD-10-CM | POA: Insufficient documentation

## 2022-09-26 DIAGNOSIS — Z8632 Personal history of gestational diabetes: Secondary | ICD-10-CM | POA: Insufficient documentation

## 2022-09-26 MED ORDER — FREESTYLE TEST VI STRP
ORAL_STRIP | 12 refills | Status: DC
Start: 2022-09-26 — End: 2022-10-18

## 2022-09-26 MED ORDER — FREESTYLE LITE DEVI
0 refills | Status: DC
Start: 2022-09-26 — End: 2022-10-18

## 2022-09-26 MED ORDER — FREESTYLE LANCETS MISC
12 refills | Status: DC
Start: 2022-09-26 — End: 2022-10-18

## 2022-10-14 ENCOUNTER — Encounter: Payer: Self-pay | Admitting: Obstetrics & Gynecology

## 2022-10-15 ENCOUNTER — Ambulatory Visit: Payer: Managed Care, Other (non HMO) | Admitting: Obstetrics & Gynecology

## 2022-10-15 VITALS — BP 108/74 | HR 75 | Wt 138.0 lb

## 2022-10-15 DIAGNOSIS — Z3A33 33 weeks gestation of pregnancy: Secondary | ICD-10-CM

## 2022-10-15 DIAGNOSIS — Z3480 Encounter for supervision of other normal pregnancy, unspecified trimester: Secondary | ICD-10-CM

## 2022-10-15 DIAGNOSIS — O24410 Gestational diabetes mellitus in pregnancy, diet controlled: Secondary | ICD-10-CM

## 2022-10-15 NOTE — Progress Notes (Signed)
HIGH-RISK PREGNANCY VISIT Patient name: Betty Acosta MRN [id number]  Date of birth: Sep 21, 1985 Chief Complaint:   Routine Prenatal Visit  History of Present Illness:   Betty Acosta is a 37 y.o. G63P1001 female at [redacted]w[redacted]d with an Estimated Date of Delivery: 12/03/22 being seen today for ongoing management of a high-risk pregnancy complicated by:  -GDMA1 Pt has not yet started checking her sugars  Today she reports no complaints.   Contractions: Not present. Vag. Bleeding: None.  Movement: Present. denies leaking of fluid.      05/26/2022    9:58 AM 07/20/2019   11:44 AM 09/21/2017   11:25 AM 01/07/2017    1:53 PM 12/11/2016    1:50 PM  Depression screen PHQ 2/9  Decreased Interest 0 0 0 0 0  Down, Depressed, Hopeless 0 0 0 0 0  PHQ - 2 Score 0 0 0 0 0  Altered sleeping 1 0  1   Tired, decreased energy 2 1  2    Change in appetite 2 0  0   Feeling bad or failure about yourself  0 0  0   Trouble concentrating 1 0  0   Moving slowly or fidgety/restless 0 0  0   Suicidal thoughts 0 0  0   PHQ-9 Score 6 1  3    Difficult doing work/chores Not difficult at all Not difficult at all  Not difficult at all      Current Outpatient Medications  Medication Instructions   Blood Glucose Monitoring Suppl (FREESTYLE LITE) DEVI Use as directed to check blood sugar 4 times daily   glucose blood (FREESTYLE TEST STRIPS) test strip Use as instructed   Lancets (FREESTYLE) lancets Use as instructed   Prenatal Vit-Fe Fumarate-FA (MULTIVITAMIN-PRENATAL) 27-0.8 MG TABS tablet 1 tablet, Oral, Daily     Review of Systems:   Pertinent items are noted in HPI Denies abnormal vaginal discharge w/ itching/odor/irritation, headaches, visual changes, shortness of breath, chest pain, abdominal pain, severe nausea/vomiting, or problems with urination or bowel movements unless otherwise stated above. Pertinent History Reviewed:  Reviewed past medical,surgical, social, obstetrical and family history.  Reviewed problem  list, medications and allergies. Physical Assessment:   Vitals:   10/15/22 1610  BP: 108/74  Pulse: 75  Weight: 138 lb (62.6 kg)  Body mass index is 25.24 kg/m.           Physical Examination:   General appearance: alert, well appearing, and in no distress  Mental status: normal mood, behavior, speech, dress, motor activity, and thought processes  Skin: warm & dry   Extremities: Edema: None    Cardiovascular: normal heart rate noted  Respiratory: normal respiratory effort, no distress  Abdomen: gravid, soft, non-tender  Pelvic: Cervical exam deferred         Fetal Status: Fetal Heart Rate (bpm): 155 Fundal Height: 32 cm Movement: Present    Fetal Surveillance Testing today: none   Chaperone: N/A    No results found for this or any previous visit (from the past 24 hour(s)).   Assessment & Plan:  High-risk pregnancy: G2P1001 at [redacted]w[redacted]d with an Estimated Date of Delivery: 12/03/22   1) GDMA1 -noncompliant -strongly encouraged pt to start checking sugars- reviewed importance -plan to f/u in 2wks- she did not think she would start until this upcoming Friday at best -plan for growth next available then q 4wk   Meds: No orders of the defined types were placed in this encounter.   Labs/procedures today: doppler  Treatment  Plan:  as outlined above  Reviewed: Preterm labor symptoms and general obstetric precautions including but not limited to vaginal bleeding, contractions, leaking of fluid and fetal movement were reviewed in detail with the patient.  All questions were answered.   Follow-up: Return in about 2 weeks (around 10/29/2022) for HROB visit (ok for CNM), next available growth and every 4 weeks, .   Future Appointments  Date Time Provider Department Center  10/29/2022  2:15 PM [facility] [place] [facility] None  10/29/2022  3:30 PM Myna Hidalgo, DO [facility]    No orders of the defined types were placed in this encounter.   Myna Hidalgo, DO Attending  Obstetrician & Gynecologist, [facility] for [employer], [facility]

## 2022-10-16 ENCOUNTER — Inpatient Hospital Stay: Payer: Managed Care, Other (non HMO) | Admitting: Anesthesiology

## 2022-10-16 ENCOUNTER — Inpatient Hospital Stay
Admit: 2022-10-16 | Discharge: 2022-10-18 | DRG: 805 | Disposition: A | Discharge status: Home / Self Care | Payer: Managed Care, Other (non HMO) | Attending: Obstetrics and Gynecology | Admitting: Obstetrics and Gynecology

## 2022-10-16 ENCOUNTER — Inpatient Hospital Stay: Payer: Managed Care, Other (non HMO)

## 2022-10-16 ENCOUNTER — Encounter (HOSPITAL_COMMUNITY): Payer: Self-pay | Admitting: Obstetrics and Gynecology

## 2022-10-16 ENCOUNTER — Other Ambulatory Visit: Payer: Self-pay

## 2022-10-16 ENCOUNTER — Encounter: Payer: Self-pay | Admitting: Obstetrics and Gynecology

## 2022-10-16 ENCOUNTER — Inpatient Hospital Stay (HOSPITAL_COMMUNITY)
Admission: AD | Admit: 2022-10-16 | Discharge: 2022-10-16 | Disposition: A | Discharge status: Home / Self Care | Payer: Managed Care, Other (non HMO) | Attending: Obstetrics and Gynecology | Admitting: Obstetrics and Gynecology

## 2022-10-16 DIAGNOSIS — O24420 Gestational diabetes mellitus in childbirth, diet controlled: Secondary | ICD-10-CM | POA: Diagnosis present

## 2022-10-16 DIAGNOSIS — O24419 Gestational diabetes mellitus in pregnancy, unspecified control: Secondary | ICD-10-CM | POA: Diagnosis not present

## 2022-10-16 DIAGNOSIS — Z8249 Family history of ischemic heart disease and other diseases of the circulatory system: Secondary | ICD-10-CM

## 2022-10-16 DIAGNOSIS — O9902 Anemia complicating childbirth: Secondary | ICD-10-CM | POA: Diagnosis not present

## 2022-10-16 DIAGNOSIS — D649 Anemia, unspecified: Secondary | ICD-10-CM | POA: Diagnosis not present

## 2022-10-16 DIAGNOSIS — Z3A33 33 weeks gestation of pregnancy: Secondary | ICD-10-CM

## 2022-10-16 DIAGNOSIS — O09523 Supervision of elderly multigravida, third trimester: Secondary | ICD-10-CM | POA: Diagnosis not present

## 2022-10-16 DIAGNOSIS — O42013 Preterm premature rupture of membranes, onset of labor within 24 hours of rupture, third trimester: Secondary | ICD-10-CM | POA: Diagnosis not present

## 2022-10-16 DIAGNOSIS — O42913 Preterm premature rupture of membranes, unspecified as to length of time between rupture and onset of labor, third trimester: Principal | ICD-10-CM | POA: Diagnosis present

## 2022-10-16 DIAGNOSIS — O479 False labor, unspecified: Secondary | ICD-10-CM

## 2022-10-16 DIAGNOSIS — O42919 Preterm premature rupture of membranes, unspecified as to length of time between rupture and onset of labor, unspecified trimester: Secondary | ICD-10-CM

## 2022-10-16 DIAGNOSIS — O26893 Other specified pregnancy related conditions, third trimester: Secondary | ICD-10-CM | POA: Diagnosis present

## 2022-10-16 DIAGNOSIS — Z3480 Encounter for supervision of other normal pregnancy, unspecified trimester: Secondary | ICD-10-CM

## 2022-10-16 DIAGNOSIS — Z8759 Personal history of other complications of pregnancy, childbirth and the puerperium: Secondary | ICD-10-CM | POA: Diagnosis present

## 2022-10-16 HISTORY — DX: Gestational diabetes mellitus in pregnancy, unspecified control: O24.419

## 2022-10-16 LAB — COMPREHENSIVE METABOLIC PANEL
ALT: 17 U/L (ref 0–44)
AST: 20 U/L (ref 15–41)
Albumin: 2.6 g/dL — ABNORMAL LOW (ref 3.5–5.0)
Alkaline Phosphatase: 218 U/L — ABNORMAL HIGH (ref 38–126)
Anion gap: 7 (ref 5–15)
BUN: 5 mg/dL — ABNORMAL LOW (ref 6–20)
CO2: 21 mmol/L — ABNORMAL LOW (ref 22–32)
Calcium: 8.1 mg/dL — ABNORMAL LOW (ref 8.9–10.3)
Chloride: 105 mmol/L (ref 98–111)
Creatinine, Ser: 0.44 mg/dL (ref 0.44–1.00)
GFR, Estimated: >60 mL/min (ref >60)
Glucose, Bld: 121 mg/dL — ABNORMAL HIGH (ref 70–99)
Potassium: 3.5 mmol/L (ref 3.5–5.1)
Sodium: 133 mmol/L — ABNORMAL LOW (ref 135–145)
Total Bilirubin: 0.4 mg/dL (ref 0.3–1.2)
Total Protein: 6.3 g/dL — ABNORMAL LOW (ref 6.5–8.1)

## 2022-10-16 LAB — CBC
HCT: 32.2 % — ABNORMAL LOW (ref 36.0–46.0)
HCT: 32.2 % — ABNORMAL LOW (ref 36.0–46.0)
Hemoglobin: 10.7 g/dL — ABNORMAL LOW (ref 12.0–15.0)
Hemoglobin: 11 g/dL — ABNORMAL LOW (ref 12.0–15.0)
MCH: 29.1 pg (ref 26.0–34.0)
MCH: 29.8 pg (ref 26.0–34.0)
MCHC: 33.2 g/dL (ref 30.0–36.0)
MCHC: 34.2 g/dL (ref 30.0–36.0)
MCV: 87.5 fL (ref 80.0–100.0)
MCV: 87.3 fL (ref 80.0–100.0)
Platelets: 235 10*3/uL (ref 150–400)
Platelets: 243 10*3/uL (ref 150–400)
RBC: 3.68 MIL/uL — ABNORMAL LOW (ref 3.87–5.11)
RBC: 3.69 MIL/uL — ABNORMAL LOW (ref 3.87–5.11)
RDW: 13.3 % (ref 11.5–15.5)
RDW: 13.3 % (ref 11.5–15.5)
WBC: 8.3 10*3/uL (ref 4.0–10.5)
WBC: 9.4 10*3/uL (ref 4.0–10.5)
nRBC: 0 % (ref 0.0–0.2)
nRBC: 0 % (ref 0.0–0.2)

## 2022-10-16 LAB — TYPE AND SCREEN
ABO/RH(D): O POS
ABO/RH(D): O POS
Antibody Screen: NEGATIVE
Antibody Screen: NEGATIVE

## 2022-10-16 LAB — URINALYSIS, ROUTINE W REFLEX MICROSCOPIC
Bilirubin Urine: NEGATIVE
Glucose, UA: 50 mg/dL — AB
Hgb urine dipstick: NEGATIVE
Ketones, ur: NEGATIVE mg/dL
Nitrite: NEGATIVE
Protein, ur: NEGATIVE mg/dL
Specific Gravity, Urine: 1.008 (ref 1.005–1.030)
pH: 7 (ref 5.0–8.0)

## 2022-10-16 LAB — HIV ANTIBODY (ROUTINE TESTING W REFLEX): HIV Screen 4th Generation wRfx: NONREACTIVE

## 2022-10-16 LAB — GC/CHLAMYDIA PROBE AMP (~~LOC~~) NOT AT ARMC
Chlamydia: NEGATIVE
Comment: NEGATIVE
Comment: NORMAL
Neisseria Gonorrhea: NEGATIVE

## 2022-10-16 LAB — HEMOGLOBIN A1C
Hgb A1c MFr Bld: 5.2 % (ref 4.8–5.6)
Mean Plasma Glucose: 102.54 mg/dL

## 2022-10-16 LAB — RUPTURE OF MEMBRANE (ROM)PLUS: Rom Plus: POSITIVE

## 2022-10-16 LAB — POCT FERN TEST: POCT Fern Test: POSITIVE

## 2022-10-16 LAB — GLUCOSE, RANDOM: Glucose, Bld: 122 mg/dL — ABNORMAL HIGH (ref 70–99)

## 2022-10-16 MED ORDER — LIDOCAINE-EPINEPHRINE (PF) 1.5 %-1:200000 IJ SOLN
INTRAMUSCULAR | Status: DC | PRN
  Administered 2022-10-16: 3 mL via PERINEURAL

## 2022-10-16 MED ORDER — ACETAMINOPHEN 325 MG PO TABS
650.0000 mg | ORAL_TABLET | ORAL | Status: DC | PRN
  Filled 2022-10-16: qty 2

## 2022-10-16 MED ORDER — AZITHROMYCIN 250 MG PO TABS
1000.0000 mg | ORAL_TABLET | Freq: Once | ORAL | Status: AC
  Administered 2022-10-16: 1000 mg via ORAL
  Filled 2022-10-16: qty 4

## 2022-10-16 MED ORDER — ONDANSETRON HCL 4 MG PO TABS: 4.0000 mg | ORAL_TABLET | ORAL | Status: DC | PRN

## 2022-10-16 MED ORDER — AMMONIA AROMATIC IN INHA
RESPIRATORY_TRACT | Status: AC
  Filled 2022-10-16: qty 10

## 2022-10-16 MED ORDER — FENTANYL-BUPIVACAINE-NACL 0.5-0.125-0.9 MG/250ML-% EP SOLN
EPIDURAL | Status: AC
  Filled 2022-10-16: qty 250

## 2022-10-16 MED ORDER — LACTATED RINGERS IV SOLN: 125.0000 mL/h | INTRAVENOUS | Status: DC

## 2022-10-16 MED ORDER — ONDANSETRON HCL 4 MG/2ML IJ SOLN: 4.0000 mg | INTRAMUSCULAR | Status: DC | PRN

## 2022-10-16 MED ORDER — LIDOCAINE HCL (PF) 1 % IJ SOLN
INTRAMUSCULAR | Status: DC | PRN
  Administered 2022-10-16: 3 mL via SUBCUTANEOUS

## 2022-10-16 MED ORDER — SODIUM CHLORIDE 0.9 % IV SOLN
2.0000 g | Freq: Four times a day (QID) | INTRAVENOUS | Status: DC
  Administered 2022-10-16: 2 g via INTRAVENOUS
  Filled 2022-10-16: qty 2000

## 2022-10-16 MED ORDER — PHENYLEPHRINE 80 MCG/ML (10ML) SYRINGE FOR IV PUSH (FOR BLOOD PRESSURE SUPPORT): 80.0000 ug | PREFILLED_SYRINGE | INTRAVENOUS | Status: DC | PRN

## 2022-10-16 MED ORDER — SODIUM CHLORIDE 0.9 % IV SOLN: INTRAVENOUS | Status: DC

## 2022-10-16 MED ORDER — LIDOCAINE HCL (PF) 1 % IJ SOLN
INTRAMUSCULAR | Status: AC
  Filled 2022-10-16: qty 30

## 2022-10-16 MED ORDER — BUPIVACAINE HCL (PF) 0.25 % IJ SOLN
INTRAMUSCULAR | Status: DC | PRN
  Administered 2022-10-16: 3 mL via EPIDURAL
  Administered 2022-10-16: 5 mL via EPIDURAL

## 2022-10-16 MED ORDER — AMOXICILLIN 500 MG PO CAPS: 500.0000 mg | ORAL_CAPSULE | Freq: Three times a day (TID) | ORAL | Status: DC

## 2022-10-16 MED ORDER — LACTATED RINGERS IV SOLN: INTRAVENOUS | Status: DC

## 2022-10-16 MED ORDER — DIPHENHYDRAMINE HCL 25 MG PO CAPS: 25.0000 mg | ORAL_CAPSULE | Freq: Four times a day (QID) | ORAL | Status: DC | PRN

## 2022-10-16 MED ORDER — ACETAMINOPHEN 325 MG PO TABS: 650.0000 mg | ORAL_TABLET | ORAL | Status: DC | PRN

## 2022-10-16 MED ORDER — OXYTOCIN 10 UNIT/ML IJ SOLN
INTRAMUSCULAR | Status: AC
  Filled 2022-10-16: qty 2

## 2022-10-16 MED ORDER — BENZOCAINE-MENTHOL 20-0.5 % EX AERO: 1.0000 | INHALATION_SPRAY | CUTANEOUS | Status: DC | PRN

## 2022-10-16 MED ORDER — DIBUCAINE (PERIANAL) 1 % EX OINT: 1.0000 | TOPICAL_OINTMENT | CUTANEOUS | Status: DC | PRN

## 2022-10-16 MED ORDER — IBUPROFEN 600 MG PO TABS
600.0000 mg | ORAL_TABLET | Freq: Four times a day (QID) | ORAL | Status: DC
  Administered 2022-10-16 – 2022-10-18 (×8): 600 mg via ORAL
  Filled 2022-10-16 (×8): qty 1

## 2022-10-16 MED ORDER — MAGNESIUM SULFATE 40 GM/1000ML IV SOLN
2.0000 g/h | INTRAVENOUS | Status: DC
  Administered 2022-10-16: 2 g/h via INTRAVENOUS

## 2022-10-16 MED ORDER — DIPHENHYDRAMINE HCL 50 MG/ML IJ SOLN: 12.5000 mg | INTRAMUSCULAR | Status: DC | PRN

## 2022-10-16 MED ORDER — OXYTOCIN-SODIUM CHLORIDE 30-0.9 UT/500ML-% IV SOLN
INTRAVENOUS | Status: AC
  Filled 2022-10-16: qty 500

## 2022-10-16 MED ORDER — PRENATAL MULTIVITAMIN CH
1.0000 | ORAL_TABLET | Freq: Every day | ORAL | Status: DC
  Administered 2022-10-17 – 2022-10-18 (×2): 1 via ORAL
  Filled 2022-10-16 (×2): qty 1

## 2022-10-16 MED ORDER — SODIUM CHLORIDE 0.9 % IV SOLN: 2.0000 g | Freq: Four times a day (QID) | INTRAVENOUS | Status: DC

## 2022-10-16 MED ORDER — TETANUS-DIPHTH-ACELL PERTUSSIS 5-2.5-18.5 LF-MCG/0.5 IM SUSY
0.5000 mL | PREFILLED_SYRINGE | Freq: Once | INTRAMUSCULAR | Status: DC
  Filled 2022-10-16: qty 0.5

## 2022-10-16 MED ORDER — EPHEDRINE 5 MG/ML INJ: 10.0000 mg | INTRAVENOUS | Status: DC | PRN

## 2022-10-16 MED ORDER — BETAMETHASONE SOD PHOS & ACET 6 (3-3) MG/ML IJ SUSP
12.0000 mg | INTRAMUSCULAR | Status: DC
  Administered 2022-10-16: 12 mg via INTRAMUSCULAR
  Filled 2022-10-16: qty 5

## 2022-10-16 MED ORDER — DOCUSATE SODIUM 100 MG PO CAPS
100.0000 mg | ORAL_CAPSULE | Freq: Two times a day (BID) | ORAL | Status: DC
  Administered 2022-10-17 – 2022-10-18 (×3): 100 mg via ORAL
  Filled 2022-10-16 (×3): qty 1

## 2022-10-16 MED ORDER — SIMETHICONE 80 MG PO CHEW: 80.0000 mg | CHEWABLE_TABLET | ORAL | Status: DC | PRN

## 2022-10-16 MED ORDER — COCONUT OIL OIL
1.0000 | TOPICAL_OIL | Status: DC | PRN
  Administered 2022-10-17: 1 via TOPICAL
  Filled 2022-10-16 (×2): qty 7.5

## 2022-10-16 MED ORDER — MISOPROSTOL 200 MCG PO TABS
ORAL_TABLET | ORAL | Status: AC
  Filled 2022-10-16: qty 4

## 2022-10-16 MED ORDER — FENTANYL-BUPIVACAINE-NACL 0.5-0.125-0.9 MG/250ML-% EP SOLN: 12.0000 mL/h | EPIDURAL | Status: DC | PRN

## 2022-10-16 MED ORDER — LACTATED RINGERS IV SOLN
125.0000 mL/h | INTRAVENOUS | Status: AC
  Administered 2022-10-16: 125 mL/h via INTRAVENOUS

## 2022-10-16 MED ORDER — AZITHROMYCIN 500 MG PO TABS: 1000.0000 mg | ORAL_TABLET | Freq: Once | ORAL | Status: DC

## 2022-10-16 MED ORDER — MAGNESIUM SULFATE 40 GM/1000ML IV SOLN
INTRAVENOUS | Status: AC
  Administered 2022-10-16: 4 g via INTRAVENOUS
  Filled 2022-10-16: qty 1000

## 2022-10-16 MED ORDER — MAGNESIUM SULFATE BOLUS VIA INFUSION
4.0000 g | Freq: Once | INTRAVENOUS | Status: AC
  Filled 2022-10-16: qty 1000

## 2022-10-16 MED ORDER — PRENATAL MULTIVITAMIN CH: 1.0000 | ORAL_TABLET | Freq: Every day | ORAL | Status: DC

## 2022-10-16 MED ORDER — ZOLPIDEM TARTRATE 5 MG PO TABS: 5.0000 mg | ORAL_TABLET | Freq: Every evening | ORAL | Status: DC | PRN

## 2022-10-16 MED ORDER — LACTATED RINGERS IV SOLN: 500.0000 mL | Freq: Once | INTRAVENOUS | Status: DC

## 2022-10-16 MED ORDER — WITCH HAZEL-GLYCERIN EX PADS: 1.0000 | MEDICATED_PAD | CUTANEOUS | Status: DC | PRN

## 2022-10-16 NOTE — Progress Notes (Signed)
Pt transferred to room 35 on [facility]. Pt was assisted from the wheelchair to the bed with minimal - no assistance from the RN. Pt's fundus was U/U, firm, midline with scant amount of bleeding upon transfer. Report was given to Enrique Sack, RN on MB.

## 2022-10-16 NOTE — H&P (Signed)
Betty Acosta is a 37 y.o. female presenting for Preterm premature rupture of membranes at 33 weeks and 1 day gestation. She was transferred over from [facility] in [place]..and reports a gush of fluid at 0030 this morning. She reports mild, irregular contractions. Her baby is moving well.She was given one dose of Betamethasone IM at 0440  prior to transfer from [place]. She received prenatal care at [facility] in [place], and was given a diagnosis of GDM due to an elevated value on a 75gm  2 hr GTT test. She has not follow up with Diabetes education, and has not been checking her sugars. OB History     Gravida  2   Para  1   Term  1   Preterm      AB      Living  1      SAB      IAB      Ectopic      Multiple      Live Births  1          Past Medical History:  Diagnosis Date   Anxiety    Asthma    Gestational diabetes    IBS (irritable bowel syndrome)    Vaginal Pap smear, abnormal    Past Surgical History:  Procedure Laterality Date   LEEP     Family History: family history includes ADD / ADHD in her brother and brother; Anxiety disorder in her mother; Autism in her brother; Cancer in her paternal grandmother; Congestive Heart Failure in her mother; Depression in her mother; Diabetes in her maternal aunt and maternal grandmother; Drug abuse in her maternal grandmother; Fibromyalgia in her mother; Heart attack in her maternal grandfather; Hypertension in her maternal aunt, maternal grandmother, mother, and sister; Other in her mother and sister. Social History:  reports that she has never smoked. She has never used smokeless tobacco. She reports that she does not drink alcohol and does not use drugs.     Maternal Diabetes: Yes:  Diabetes Type:  Diet controlled Genetic Screening: Normal Maternal Ultrasounds/Referrals: Normal Fetal Ultrasounds or other Referrals:  None Maternal Substance Abuse:  No Significant Maternal Medications:  None Significant  Maternal Lab Results:  Other:  GBS is pending Number of Prenatal Visits:greater than 3 verified prenatal visits Other Comments:  None  Review of Systems  Constitutional: Negative.   HENT: Negative.    Eyes: Negative.   Respiratory: Negative.    Cardiovascular: Negative.   Gastrointestinal: Negative.        Gravid abdomen.  Endocrine: Negative.   Genitourinary: Negative.   Musculoskeletal: Negative.   Allergic/Immunologic: Negative.   Neurological: Negative.   Hematological: Negative.   Psychiatric/Behavioral: Negative.     History Dilation: 3 Effacement (%): 80 Station: -3 Exam by:: Ernestene Mention, CNM Blood pressure 107/66, pulse 81, resp. rate 18, height 5\' 2"  (1.575 m), last menstrual period 02/26/2022, SpO2 100%. Maternal Exam:  Uterine Assessment: Contraction strength is mild.  Contraction frequency is irregular.  Abdomen: Fetal presentation: vertex Introitus: Normal vulva. Normal vagina.  Amniotic fluid character: clear. Pelvis: adequate for delivery.   Cervix: Cervix evaluated by digital exam.     Physical Exam Constitutional:      Appearance: Normal appearance. She is normal weight.  HENT:     Head: Normocephalic and atraumatic.  Cardiovascular:     Rate and Rhythm: Normal rate and regular rhythm.     Pulses: Normal pulses.     Heart sounds: Normal heart  sounds.  Pulmonary:     Effort: Pulmonary effort is normal.     Breath sounds: Normal breath sounds.  Abdominal:     General: Bowel sounds are normal.     Palpations: Abdomen is soft.     Comments: Gravid abdomen Palpates soft.  Genitourinary:    General: Normal vulva.     Rectum: Normal.     Comments: SVE: 3 cms/80%/-3 Scant amount of clear fluid noted after vaginal exam Musculoskeletal:        General: Normal range of motion.     Cervical back: Normal range of motion and neck supple.  Skin:    General: Skin is warm and dry.  Neurological:     General: No focal deficit present.     Mental Status:  She is alert and oriented to person, place, and time.  Psychiatric:        Mood and Affect: Mood normal.        Behavior: Behavior normal.     Prenatal labs: ABO, Rh: --/--/PENDING (07/25 1000) Antibody: PENDING (07/25 1000) Rubella: 3.94 (03/04 1059) RPR: Non Reactive (06/28 0804)  HBsAg: Negative (03/04 1059)  HIV: Non Reactive (06/28 0804)  GBS:     Assessment/Plan: 37 year old Gravida 2 Para1, 33 weeks 1 day gestation PPROM clear fluid at 0030 today Reassuring, reactive FHTS Irregular contractions GBS pending Gestational diabetes- with no follow up or glucose testing. Assume  diet controlled  Plan: Comanagement with MD. Admit to Labor and Delivery IV of Lactated Ringers. IV antibiotics for 48 hours: Ampicillin q 6 hours, Azithromycin 1000 mg po given Then Amoxicillin capsules po. EFM. Routine admission labs, plus ROM +, UDS, GBS, HGB A1C and glucose checks q 2 hours Postprandial  Diabetic diet Magnesium sulphate bolus 4 gm load, then 2 gm/hr for maintenance Daily prenatal vitamin Ultrasound for growth, AFI and BPP. Neonatology consultation. Plan discussed with Dr. Valentino Saxon.  Mirna Mires, CNM  10/16/2022 12:02 PM       Mirna Mires 10/16/2022, 11:32 AM

## 2022-10-16 NOTE — Progress Notes (Signed)
Pt arrived to observation 4 via [facility] transport. Pt reports have a gush of clear fluid at 0030 this morning and has been feeling intermittent cramping/contractions ever since. Pt denies VB, and endorses +FM at this time. Pt does state that her fluid is now blood tinged. She states that her contractions are getting worse and closer together at this current moment. She reports having pressure in her vagina and rectum area. VSS at this time. Contractions are visible and palpable on exam, about every 2-3 minutes. She rates them moderately painful. Ernestene Mention, CNM notified of pt's arrival.

## 2022-10-16 NOTE — Progress Notes (Signed)
Pt was assisted to the restroom with minimal assistance. Pt was able to void 700 ml's at this time. Pt has no perineal swelling but an ice pack was placed for comfort. Pt was then assisted back to the bed with no assistance from the nurse. Epidural catheter was removed per protocol.

## 2022-10-16 NOTE — Anesthesia Procedure Notes (Signed)
Epidural Patient location during procedure: OB Start time: 10/16/2022 2:13 PM End time: 10/16/2022 2:22 PM  Staffing Resident/CRNA: Lynden Oxford, CRNA Performed: resident/CRNA   Preanesthetic Checklist Completed: patient identified, IV checked, site marked, risks and benefits discussed, monitors and equipment checked, pre-op evaluation and timeout performed  Epidural Patient position: sitting Prep: Betadine Patient monitoring: heart rate, continuous pulse ox and blood pressure Approach: midline Location: L4-L5 Injection technique: LOR saline  Needle:  Needle type: Tuohy  Needle gauge: 18 G Needle length: 9 cm and 9 Needle insertion depth: 5 cm Catheter type: closed end flexible Catheter size: 20 Guage Catheter at skin depth: 12 cm Test dose: negative and 1.5% lidocaine with Epi 1:200 K  Assessment Events: blood not aspirated, no cerebrospinal fluid, injection not painful, no injection resistance, no paresthesia and negative IV test  Additional Notes   Patient tolerated the insertion well without complications.Reason for block:procedure for pain

## 2022-10-16 NOTE — Progress Notes (Signed)
Betty Acosta is a 37 y.o. G2P1001 at [redacted]w[redacted]d by ultrasound admitted for Preterm labor, PROM  Subjective:She is distinctly more uncomfortable. Asking for an epidural. Was just up to bathroom and voided large quantity of urine. Feeling pelvic pressure.   Objective: BP 124/78 (BP Location: Right Arm)   Pulse 81   Temp 98.7 F (37.1 C) (Oral)   Resp 18   Ht 5\' 2"  (1.575 m)   LMP 02/26/2022 (Exact Date)   SpO2 100%   BMI 25.41 kg/m  No intake/output data recorded. Total I/O In: 120 [P.O.:120] Out: 600 [Urine:600]  FHT:  FHR: 135 bpm, variability: moderate,  accelerations:  Present,  decelerations:  Absent and   UC:   regular, every 3-4 minutes SVE:   Dilation: 4.5 Effacement (%): 100 Station: -2 Exam by:: [employer] CNM  Labs: Lab Results  Component Value Date   WBC 9.4 10/16/2022   HGB 11.0 (L) 10/16/2022   HCT 32.2 (L) 10/16/2022   MCV 87.3 10/16/2022   PLT 243 10/16/2022    Assessment / Plan: PPROM at 33+ weeks  Labor:Moving to active labor. Anesthesia notifed for epidural. Cr. Cherry updated.Neonatology. Delivery table set up.  Fetal Wellbeing:  Category I Pain Control:   desires epidural I/D:  n/a Anticipated MOD:  NSVD Anticipate SVD soon.  Betty Acosta, CNM 10/16/2022, 1:56 PM

## 2022-10-16 NOTE — Discharge Summary (Cosign Needed Addendum)
Postpartum Discharge Summary  Date of Service updated 10/18/2022     Patient Name: Betty Acosta DOB: Jul 30, 1985 MRN: [id number]  Date of admission: 10/16/2022 Delivery date:10/16/2022 Delivering provider: Mirna Mires Date of discharge: 10/18/2022  Admitting diagnosis: Preterm premature rupture of membranes in third trimester [O42.913] Intrauterine pregnancy: [redacted]w[redacted]d     Secondary diagnosis:  Principal Problem:   Preterm premature rupture of membranes in third trimester Active Problems:   Preterm labor with preterm delivery   Postpartum care following vaginal delivery  Additional problems: gestational diabetes    Discharge diagnosis: Preterm Pregnancy Delivered, Type 2 DM, and Anemia                                              Post partum procedures:none Augmentation: N/A Complications: None  Hospital course: Onset of Labor With Vaginal Delivery      37 y.o. yo G2P1001 at [redacted]w[redacted]d was admitted in Latent Labor on 10/16/2022. Labor course was complicated by fetal prematurity and treatment with magnesium sulphate, IV antibiotics and steroids to enhance fetal lung maturity in the face of prematurity. Membrane Rupture Time/Date: 12:30 AM,10/16/2022  Delivery Method:Vaginal, Spontaneous Episiotomy: None Lacerations:  None Patient had a postpartum course complicated by nothing  She is ambulating, tolerating a regular diet, passing flatus, and urinating well. Patient is discharged home in stable condition on 10/18/22.  Newborn Data: Birth date:10/16/2022 Birth time:3:11 PM Gender:Female Living status:Living Apgars:8 ,9  Weight:2020 g  Magnesium Sulfate received: Yes: preterm labor BMZ received: Yes one dose only Rhophylac:N/A MMR:N/A T-DaP: offered postpartum and declined Flu: No Transfusion:No  Physical exam  Vitals:   10/17/22 0739 10/17/22 1615 10/18/22 0044 10/18/22 0909  BP: 105/67 102/62 109/61 118/65  Pulse:  68 (!) 52 62  Resp: 18 18 20 18   Temp: 98.3 F (36.8 C)  98.2 F (36.8 C) 98.4 F (36.9 C) 98.5 F (36.9 C)  TempSrc: Oral Oral Oral Oral  SpO2: 99% 100% 98% 100%  Weight:      Height:       General: alert, cooperative, and no distress Lochia: appropriate Uterine Fundus: firm Incision: N/A DVT Evaluation: No evidence of DVT seen on physical exam. Negative Homan's sign. No cords or calf tenderness. Labs: Lab Results  Component Value Date   WBC 10.0 10/17/2022   HGB 9.1 (L) 10/17/2022   HCT 26.4 (L) 10/17/2022   MCV 86.8 10/17/2022   PLT 250 10/17/2022      Latest Ref Rng & Units 10/16/2022    9:57 AM  CMP  Glucose 70 - 99 mg/dL 70 - 99 mg/dL 782    956   BUN 6 - 20 mg/dL 5   Creatinine 2.13 - 0.86 mg/dL 5.78   Sodium 469 - 629 mmol/L 133   Potassium 3.5 - 5.1 mmol/L 3.5   Chloride 98 - 111 mmol/L 105   CO2 22 - 32 mmol/L 21   Calcium 8.9 - 10.3 mg/dL 8.1   Total Protein 6.5 - 8.1 g/dL 6.3   Total Bilirubin 0.3 - 1.2 mg/dL 0.4   Alkaline Phos 38 - 126 U/L 218   AST 15 - 41 U/L 20   ALT 0 - 44 U/L 17    Edinburgh Score:    10/17/2022    8:00 PM  Edinburgh Postnatal Depression Scale Screening Tool  I have been able to laugh  and see the funny side of things. 0  I have looked forward with enjoyment to things. 0  I have blamed myself unnecessarily when things went wrong. 1  I have been anxious or worried for no good reason. 1  I have felt scared or panicky for no good reason. 0  Things have been getting on top of me. 0  I have been so unhappy that I have had difficulty sleeping. 0  I have felt sad or miserable. 0  I have been so unhappy that I have been crying. 0  The thought of harming myself has occurred to me. 0  Edinburgh Postnatal Depression Scale Total 2      After visit meds:  Allergies as of 10/18/2022   No Known Allergies      Medication List     STOP taking these medications    freestyle lancets   FreeStyle Lite Devi   FREESTYLE TEST STRIPS test strip Generic drug: glucose blood        TAKE these medications    ferrous sulfate 325 (65 FE) MG tablet Take 1 tablet (325 mg total) by mouth daily with breakfast. Start taking on: October 19, 2022   ibuprofen 600 MG tablet Commonly known as: ADVIL Take 1 tablet (600 mg total) by mouth every 6 (six) hours.   multivitamin-prenatal 27-0.8 MG Tabs tablet Take 1 tablet by mouth daily at 12 noon.         Discharge home in stable condition Infant Feeding: Bottle and Breast Infant Disposition:NICU Discharge instruction: per After Visit Summary and Postpartum booklet. Activity: Advance as tolerated. Pelvic rest for 6 weeks.  Diet: carb modified diet Anticipated Birth Control: Unsure Postpartum Appointment:6 weeks Additional Postpartum F/U: Postpartum Depression checkup Future Appointments: Future Appointments  Date Time Provider Department Center  10/29/2022  2:15 PM [facility] - [facility] [place] [facility] None  10/29/2022  3:30 PM Myna Hidalgo, DO [facility]   Follow up Visit:  Follow-up Information     [facility]. Schedule an appointment as soon as possible for a visit in 6 week(s).   Specialty: Obstetrics and Gynecology Why: Make an appointment to see your provvders in two weeks for either an in- person or virtual visit, and then set up a second appointment for a 6 week post delivery visit for a physical and to start on birth control. Please tell the office if you are interested in an IUD or Nexplanon. Contact information: [address] [place] [address] [phone]                    10/18/2022 Mirna Mires, CNM

## 2022-10-16 NOTE — MAU Provider Note (Signed)
Chief Complaint:  Rupture of Membranes   Event Date/Time   First Provider Initiated Contact with Patient 10/16/22 0326     HPI: Betty Acosta is a 37 y.o. G2P1001 at 66w1dwho presents to maternity admissions reporting gush of clear fluid at 0030.  Has some mild contractions.  .Pregnancy has been followed at [facility] and remarkable for:  Has not started self testing for diabetes yet.   She reports good fetal movement, denies vaginal bleeding, vaginal itching/burning, urinary symptoms, h/a, dizziness, n/v, diarrhea, constipation or fever/chills.   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 The patient's pertinent negatives include no genital itching, genital odor or vaginal bleeding. This is a new problem. The current episode started today. The pain is mild. She is pregnant. Associated symptoms include abdominal pain. Pertinent negatives include no back pain, diarrhea, dysuria, fever, nausea or vomiting. The vaginal discharge was watery and clear. There has been no bleeding. She has not been passing clots. She has not been passing tissue. Nothing aggravates the symptoms. She has tried nothing for the symptoms.   RN Note: Betty Acosta is a 37 y.o. at [redacted]w[redacted]d here in MAU reporting: a gush of fluid at 0030 and it continues to come out. +FM. Denies vaginal bleeding. Report some cramping but not painful.   Pain score: 1/10  Past Medical History: Past Medical History:  Diagnosis Date   Anxiety    Asthma    Gestational diabetes    IBS (irritable bowel syndrome)    Vaginal Pap smear, abnormal     Past obstetric history: OB History   Gravida Para Term Preterm AB Living  2 1 1     1   SAB IAB Ectopic Multiple Live Births          1    # Outcome Date GA Lbr Len/2nd Weight Sex Type Anes PTL Lv  2 Current           1 Term 07/29/17 [redacted]w[redacted]d   M Vag-Spont EPI N LIV     Complications: Intraamniotic Infection    Past Surgical History: Past Surgical History:  Procedure Laterality Date   LEEP      Family History: Family History  Problem Relation Age of Onset   Diabetes Maternal Aunt    Hypertension Maternal Aunt    Diabetes Maternal Grandmother    Drug abuse Maternal Grandmother    Hypertension Maternal Grandmother    Congestive Heart Failure Mother    Hypertension Mother    Fibromyalgia Mother    Other Mother        heart blockages   Depression Mother    Anxiety disorder Mother    Hypertension Sister    Other Sister        broderline diabetes   ADD / ADHD Brother    Autism Brother    Heart attack Maternal Grandfather    Cancer Paternal Grandmother    ADD / ADHD Brother     Social History: Social History   Tobacco Use   Smoking status: Never   Smokeless tobacco: Never  Vaping Use   Vaping status: Never Used  Substance Use Topics   Alcohol use: No  Alcohol/week: 0.0 standard drinks of alcohol   Drug use: No    Allergies: No Known Allergies  Meds:  Medications Prior to Admission  Medication Sig Dispense Refill Last Dose   Prenatal Vit-Fe Fumarate-FA (MULTIVITAMIN-PRENATAL) 27-0.8 MG TABS tablet Take 1 tablet by mouth daily at 12 noon.   Past Week   Blood Glucose Monitoring Suppl (FREESTYLE LITE) DEVI Use as directed to check blood sugar 4 times daily 1 each 0    glucose blood (FREESTYLE TEST STRIPS) test strip Use as instructed 100 each 12    Lancets (FREESTYLE) lancets Use as instructed 100 each 12     I have reviewed patient's Past Medical Hx, Surgical Hx, Family Hx, Social Hx, medications and allergies.   ROS:  Review of Systems  Constitutional:  Negative for fever.   Gastrointestinal:  Positive for abdominal pain. Negative for diarrhea, nausea and vomiting.  Genitourinary:  Positive for pelvic pain and vaginal discharge. Negative for dysuria.  Musculoskeletal:  Negative for back pain.   Other systems negative  Physical Exam  Patient Vitals for the past 24 hrs:  Height Weight  10/16/22 0259 5\' 2"  (1.575 m) 63 kg   Constitutional: Well-developed, well-nourished female in no acute distress.  Cardiovascular: normal rate  Respiratory: normal effort GI: Abd soft, non-tender, gravid appropriate for gestational age.   No rebound or guarding. MS: Extremities nontender, no edema, normal ROM Neurologic: Alert and oriented x 4.  GU: Neg CVAT.  PELVIC EXAM: Dilation: 1 Effacement (%): 50 Exam by:: Wynelle Bourgeois, CNM.  Station -3 Vertex presentation  FHT:  Baseline 150 , moderate variability, accelerations present, no decelerations Contractions:  Irregular     Labs: Pending admit labs and cultures  O/Positive/-- (03/04 1059)  Imaging:  No results found.  MAU Course/MDM: I have reviewed the triage vital signs and the nursing notes.   Pertinent labs & imaging results that were available during my care of the patient were reviewed by me and considered in my medical decision making (see chart for details).      I have reviewed her medical records including past results, notes and treatments.   I have ordered labs and Latency antibiotics Betamethasone ordered Cultures ordered NST reviewed Consult Dr Donavan Foil with presentation, exam findings and test results. He called Dr Valentino Saxon at [facility] who approved transfer for care there Treatments in MAU included EFM IV fluids, Latency antibiotics, Betamethasone x 1 dose.    Assessment: Single IUP at [redacted]w[redacted]d Preterm Premature Rupture of Membranes Prodromal preterm contractions  Plan: Transfer to [facility] Routine orders MD to follow   Wynelle Bourgeois CNM, MSN Certified  Nurse-Midwife 10/16/2022 3:26 AM

## 2022-10-16 NOTE — Anesthesia Preprocedure Evaluation (Signed)
Anesthesia Evaluation  Patient identified by MRN, date of birth, ID band Patient awake    Reviewed: Allergy & Precautions, H&P , NPO status , Patient's Chart, lab work & pertinent test results  History of Anesthesia Complications Negative for: history of anesthetic complications  Airway Mallampati: II       Dental   Pulmonary asthma    Pulmonary exam normal        Cardiovascular negative cardio ROS      Neuro/Psych    GI/Hepatic negative GI ROS, Neg liver ROS,,,  Endo/Other  diabetes    Renal/GU negative Renal ROS     Musculoskeletal   Abdominal   Peds  Hematology negative hematology ROS (+)   Anesthesia Other Findings   Reproductive/Obstetrics                             Anesthesia Physical Anesthesia Plan  ASA: 2  Anesthesia Plan: Epidural   Post-op Pain Management:    Induction:   PONV Risk Score and Plan:   Airway Management Planned:   Additional Equipment:   Intra-op Plan:   Post-operative Plan:   Informed Consent:   Plan Discussed with: Anesthesiologist and CRNA  Anesthesia Plan Comments:        Anesthesia Quick Evaluation

## 2022-10-16 NOTE — MAU Note (Signed)
..  Betty Acosta is a 37 y.o. at [redacted]w[redacted]d here in MAU reporting: a gush of fluid at 0030 and it continues to come out. +FM. Denies vaginal bleeding. Report some cramping but not painful.   Pain score: 1/10

## 2022-10-17 DIAGNOSIS — O9902 Anemia complicating childbirth: Secondary | ICD-10-CM | POA: Diagnosis not present

## 2022-10-17 DIAGNOSIS — O24419 Gestational diabetes mellitus in pregnancy, unspecified control: Secondary | ICD-10-CM | POA: Diagnosis not present

## 2022-10-17 DIAGNOSIS — O42013 Preterm premature rupture of membranes, onset of labor within 24 hours of rupture, third trimester: Secondary | ICD-10-CM | POA: Diagnosis not present

## 2022-10-17 MED ORDER — TETANUS-DIPHTH-ACELL PERTUSSIS 5-2.5-18.5 LF-MCG/0.5 IM SUSY: 0.5000 mL | PREFILLED_SYRINGE | Freq: Once | INTRAMUSCULAR | Status: DC

## 2022-10-17 MED ORDER — FERROUS SULFATE 325 (65 FE) MG PO TABS
325.0000 mg | ORAL_TABLET | Freq: Every day | ORAL | Status: DC
  Administered 2022-10-17 – 2022-10-18 (×2): 325 mg via ORAL
  Filled 2022-10-17 (×2): qty 1

## 2022-10-17 NOTE — Progress Notes (Signed)
Post Partum Day 0 Subjective: Betty Acosta is feeling well overall. She is ambulating, voiding, and tolerating POs without difficulty. Her pain is well-controlled and her bleeding is WNL. Her mood is stable. She is pumping and visiting the baby in the SCN. She plans to follow up with her OB in [place].   Objective: Blood pressure 105/67, pulse 66, temperature 98.3 F (36.8 C), temperature source Oral, resp. rate 18, height 5\' 2"  (1.575 m), weight 63 kg, last menstrual period 02/26/2022, SpO2 99%.  Physical Exam:  General: alert and cooperative Heart: RRR Lungs: CTAB Lochia: appropriate Uterine Fundus: firm Incision: N/A DVT Evaluation: No evidence of DVT seen on physical exam.  Recent Labs    10/16/22 0957 10/17/22 0638  HGB 11.0* 9.1*  HCT 32.2* 26.4*    Assessment/Plan: Plan for discharge tomorrow Lactation assistance as needed PO iron   LOS: 1 day   Glenetta Borg, CNM 10/17/2022, 12:06 PM

## 2022-10-17 NOTE — Lactation Note (Signed)
This note was copied from a baby's chart. Lactation Consultation Note  Patient Name: Boy Sunda Wiemken MVHQI'O Date: 10/17/2022 Age:35 hours Reason for consult: Initial assessment;Preterm <34wks;RN request;Other (Comment) (Mother reports she did not make enough milk with her 1st baby. She also reports she did not experience breast changes in pregnancy with her 1st baby or with this baby.)   Maternal Data This is mom's 2nd baby,SVD. Mom with PTL/Pprom. Baby 33 2/7 weeks in SCN. Mom with history of GDM(diet contolled) and anxiety. Mom is an experienced breastfeeding mother. Per mom she had low milk supply with her first baby and she breastfed and also pumped. Per mom she has not experienced with either pregnancy any breast changes.  On initial consult done at baby's bedside in SCN mom was pumping. Checked mom for correct breastshield fit. Mom is a 21 mm breast shield on the left breast and a 24 mm on the right breast.  Has patient been taught Hand Expression?: Yes Does the patient have breastfeeding experience prior to this delivery?: Yes How long did the patient breastfeed?: 3 months: breastfed and pumped  Feeding Mother's Current Feeding Choice: Breast Milk and Donor Milk   Lactation Tools Discussed/Used Tools: Pump Breast pump type: Double-Electric Breast Pump;Other (comment) (Mom is a 21 mm on her left breast and a 24 mm on her right breast.) Pump Education: Setup, frequency, and cleaning;Milk Storage Reason for Pumping: Preterm baby in SCN Pumping frequency: 8 times/24 hours Pumped volume:  (drops)  Interventions Interventions: DEBP;Education  Discharge Pump: Advised to call insurance company (Mom reports she can get a pump through her insurance. Mom will call today. Discussed with mom if she can't obtain insurance pump prior to her discharge she could be loaned temporary use of a DEBP.)  Consult Status Consult Status: Follow-up Date: 10/18/22 Follow-up type:  In-patient  Update provided to care nurse.  Fuller Song 10/17/2022, 1:22 PM

## 2022-10-17 NOTE — Anesthesia Postprocedure Evaluation (Signed)
Anesthesia Post Note  Patient: Betty Acosta  Procedure(s) Performed: AN AD HOC LABOR EPIDURAL  Patient location during evaluation: Mother Baby Anesthesia Type: Epidural Level of consciousness: awake and alert Pain management: pain level controlled Vital Signs Assessment: post-procedure vital signs reviewed and stable Respiratory status: spontaneous breathing, nonlabored ventilation and respiratory function stable Cardiovascular status: stable Postop Assessment: no headache, no backache and epidural receding Anesthetic complications: no   No notable events documented.   Last Vitals:  Vitals:   10/17/22 0304 10/17/22 0739  BP: (!) 93/58 105/67  Pulse: 66   Resp: 20 18  Temp: 37 C 36.8 C  SpO2: 99% 99%    Last Pain:  Vitals:   10/17/22 0740  TempSrc:   PainSc: 4                  Dwane Andres Lawerance Cruel

## 2022-10-18 DIAGNOSIS — O9902 Anemia complicating childbirth: Secondary | ICD-10-CM | POA: Diagnosis not present

## 2022-10-18 DIAGNOSIS — O24419 Gestational diabetes mellitus in pregnancy, unspecified control: Secondary | ICD-10-CM | POA: Diagnosis not present

## 2022-10-18 DIAGNOSIS — O42013 Preterm premature rupture of membranes, onset of labor within 24 hours of rupture, third trimester: Secondary | ICD-10-CM | POA: Diagnosis not present

## 2022-10-18 MED ORDER — FERROUS SULFATE 325 (65 FE) MG PO TABS: 325.0000 mg | ORAL_TABLET | Freq: Every day | ORAL | 3 refills | Status: DC

## 2022-10-18 MED ORDER — IBUPROFEN 600 MG PO TABS: 600.0000 mg | ORAL_TABLET | Freq: Four times a day (QID) | ORAL | 0 refills | Status: DC

## 2022-10-18 NOTE — Discharge Instructions (Signed)
Discharge Instructions:   If there are any new medications, they have been ordered and will be available for pickup at the listed pharmacy on your way home from the hospital.   Call office if you have any of the following: headache, visual changes, fever >101.0 F, chills, shortness of breath, breast concerns, excessive vaginal bleeding, incision drainage or problems, leg pain or redness, depression or any other concerns. If you have vaginal discharge with an odor, let your doctor know.   It is normal to bleed for up to 6 weeks. You should not soak through more than 1 pad in 1 hour. If you have a blood clot larger than your fist with continued bleeding, call your doctor.   Activity: Do not lift > 10 lbs for 6 weeks (do not lift anything heavier than your baby). No intercourse, tampons, swimming pools, hot tubs, baths (only showers) for 6 weeks.  No driving for 1-2 weeks. Continue prenatal vitamin, especially if breastfeeding. Increase calories and fluids (water) while breastfeeding.   Your milk will come in, in the next couple of days (right now it is colostrum). You may have a slight fever when your milk comes in, but it should go away on its own.  If it does not, and rises above 101 F please call the doctor. You will also feel achy and your breasts will be firm. They will also start to leak. If you are breastfeeding, continue as you have been and you can pump/express milk for comfort.   If you have too much milk, your breasts can become engorged, which could lead to mastitis. This is an infection of the milk ducts. It can be very painful and you will need to notify your doctor to obtain a prescription for antibiotics. You can also treat it with a shower or hot/cold compress.   For concerns about your baby, please call your pediatrician.  For breastfeeding concerns, the lactation consultant can be reached at [phone].   Postpartum blues (feelings of happy one minute and sad another minute)  are normal for the first few weeks but if it gets worse let your doctor know.   Congratulations! We enjoyed caring for you and your new bundle of joy!  

## 2022-10-18 NOTE — Progress Notes (Signed)
Patient discharged home. Discharge instructions and prescriptions given and reviewed with patient. Patient verbalized understanding. Escorted out by auxillary.  

## 2022-10-18 NOTE — Lactation Note (Signed)
This note was copied from a baby's chart. Lactation Consultation Note  Patient Name: Boy Arieonna Kirschman LOVFI'E Date: 10/18/2022 Age:35 hours Reason for consult: Follow-up assessment;NICU baby;Preterm <34wks;Infant < 6lbs;Other (Comment) (breast pump loan)  Mom being discharged, insurance breast pump ordered today, mom loaned [employer] breast pump #[id number] x 2 wks, rental agreement completed and copy given to mom Maternal Data Does the patient have breastfeeding experience prior to this delivery?: Yes How long did the patient breastfeed?: 3 mths  Feeding Mother's Current Feeding Choice: Breast Milk  LATCH Score                    Lactation Tools Discussed/Used  LC name and no given to mom and written on white board  Interventions Interventions: DEBP;Education  Discharge Pump: Advised to call insurance company;[facility] Program: No  Consult Status Consult Status: PRN Date: 10/18/22 Follow-up type: In-patient    Dyann Kief 10/18/2022, 2:44 PM

## 2022-10-21 ENCOUNTER — Telehealth: Payer: Self-pay

## 2022-10-21 NOTE — Telephone Encounter (Signed)
[facility]- Discharge Call Backs- Spoke to patient on the phone about the following below. 1-Do you have any questions or concerns about yourself as you heal?No 2-Any concerns or questions about your baby?Baby in the NICU still 3-Reviewed ABC's of safe sleep. 4-How was your stay at the hospital?Great 5- Did our team work together to care for you?Yes You should be receiving a survey in the mail soon.   We would really appreciate it if you could fill that out for [place] and return it in the mail.  We value the feedback to make improvements and continue the great work we do.   If you have any questions please feel free to call me back at [phone]

## 2022-10-25 ENCOUNTER — Ambulatory Visit: Payer: Self-pay

## 2022-10-25 NOTE — Lactation Note (Signed)
This note was copied from a baby's chart. Lactation Consultation Note  Patient Name: Betty Acosta [id number] Date: 10/25/2022 Age:35 days Reason for consult: Follow-up assessment   Maternal Data Has patient been taught Hand Expression?: Yes Does the patient have breastfeeding experience prior to this delivery?: Yes How long did the patient breastfeed?: 3 months: breastfed and pumped  Feeding Mother's Current Feeding Choice: Breast Milk Assisted mother with breastfeeding session. Provided mom with tips and strategies to maximize position and latch technique. LATCH Score Latch: Repeated attempts needed to sustain latch, nipple held in mouth throughout feeding, stimulation needed to elicit sucking reflex.  Audible Swallowing: A few with stimulation (Baby with 4 audible swallows. Baby with immature suck pattern, 2-3 sucks with occassional swallow, periods of 20-30 seconds between sucking bursts. Holds nipple in his mouth without suckling during pauses. He remained physiologically stable throughout.)  Type of Nipple: Everted at rest and after stimulation  Comfort (Breast/Nipple): Filling, red/small blisters or bruises, mild/mod discomfort  Hold (Positioning): Assistance needed to correctly position infant at breast and maintain latch.  LATCH Score: 6    Interventions Interventions: Breast massage;Hand express;Assisted with latch;Breast compression;Adjust position;Support pillows;Education Recommended mom continue to offer baby to breast when she is here for feeding time. Mom with good technique and baby agreeable to latch.  Discharge Pump:  (Mom's insurance pump is ordered and has not arrived as of yet.Mom with [facility] loaner pump.)  Consult Status Consult Status: PRN Date: 10/25/22 Follow-up type: In-patient  Update provided to care nurse.  Fuller Song 10/25/2022, 5:43 PM

## 2022-10-28 ENCOUNTER — Encounter: Payer: Managed Care, Other (non HMO) | Admitting: Women's Health

## 2022-10-29 ENCOUNTER — Encounter: Payer: Managed Care, Other (non HMO) | Admitting: Obstetrics & Gynecology

## 2022-10-29 ENCOUNTER — Other Ambulatory Visit: Payer: Managed Care, Other (non HMO)

## 2022-11-01 ENCOUNTER — Ambulatory Visit: Payer: Self-pay

## 2022-11-01 NOTE — Lactation Note (Addendum)
This note was copied from a baby's chart. Lactation Consultation Note  Patient Name: Betty Acosta [id number] Date: 11/01/2022 Age:35 wk.o. Reason for consult: Follow-up assessment;NICU baby;Preterm <34wks;Breastfeeding assistance   Maternal Data  Mom states she pumps 45-60 cc total both breasts 8x per day.  Feeding Mother's Current Feeding Choice: Breast Milk Assisted mom with latching baby to left breast in football hold, latches easily after 2 attempts, and began nursing well with many swallows x 10 min, came off breast, assisted with burping, baby began having hiccups, attempted latch to right breast in football and cradle holds, few sucks obtained but not consistent, unable to coordinate latch, suck , swallow with hiccups. LATCH Score Latch: Repeated attempts needed to sustain latch, nipple held in mouth throughout feeding, stimulation needed to elicit sucking reflex.  Audible Swallowing: Spontaneous and intermittent  Type of Nipple: Everted at rest and after stimulation  Comfort (Breast/Nipple): Soft / non-tender  Hold (Positioning): No assistance needed to correctly position infant at breast.  LATCH Score: 9   Lactation Tools Discussed/Used    Interventions Interventions: Assisted with latch;Hand express;Support pillows;DEBP;Education  Discharge Pump: Personal  Consult Status Consult Status: Follow-up Date: 11/02/22 Follow-up type: In-patient    Dyann Kief 11/01/2022, 3:26 PM

## 2022-11-02 ENCOUNTER — Ambulatory Visit: Payer: Self-pay

## 2022-11-02 NOTE — Lactation Note (Signed)
This note was copied from a baby's chart. Lactation Consultation Note  Patient Name: Betty Acosta PIRJJ'O Date: 11/02/2022 Age:35 wk.o. Reason for consult: Weekly NICU follow-up;NICU baby;Late-preterm 34-36.6wks (adjusted age)  LC called to the SCN for assistance with latch. Baby was positioned in football on the left when [facility] and had recently latched. Mother's breast are full, she states she was due to pump at 2pm but will pump after the feed. Baby has a good suck, swallow, breathe pattern. He began to get fussy at 10 minutes and MOB burped him and he returned back to the breast. He latched easier the 2nd time since the breast was less full and more pliable. Minimal assistance with 2nd latch, taught to stroke nipple nose to chin and he opened wide and latched easily. Mother has some concerns about a small red area around 11o'clock on the right breast. LC noted it was tender to touch and has a hardened area about dime sized, most likely a clog. Encouraged her to use heat and gentle massage with pumping and after pumping use of ice. Also discussed smaller flange sizes for her nipples and ordering a flange kit. Mother is appreciative of all assistance and has no further questions at this time.   LATCH Score Latch: Repeated attempts needed to sustain latch, nipple held in mouth throughout feeding, stimulation needed to elicit sucking reflex.  Audible Swallowing: A few with stimulation  Type of Nipple: Everted at rest and after stimulation  Comfort (Breast/Nipple): Soft / non-tender  Hold (Positioning): Assistance needed to correctly position infant at breast and maintain latch.  LATCH Score: 7  Lactation Tools Discussed/Used Tools: Pump Breast pump type: Double-Electric Breast Pump Pump Education: Other (comment) (discussed flange sizing and how to treat a clog) Reason for Pumping: baby in SCN Pumping frequency: states she pumps 8x's/24hrs  Interventions Interventions: Breast  feeding basics reviewed;Adjust position;DEBP;Education  Discharge Pump: DEBP  Consult Status Consult Status: NICU follow-up    Betty Acosta 11/02/2022, 2:50 PM

## 2022-12-10 ENCOUNTER — Ambulatory Visit (INDEPENDENT_AMBULATORY_CARE_PROVIDER_SITE_OTHER): Payer: Managed Care, Other (non HMO) | Admitting: Advanced Practice Midwife

## 2022-12-10 ENCOUNTER — Encounter: Payer: Self-pay | Admitting: Advanced Practice Midwife

## 2022-12-10 DIAGNOSIS — Z8759 Personal history of other complications of pregnancy, childbirth and the puerperium: Secondary | ICD-10-CM

## 2022-12-10 NOTE — Progress Notes (Signed)
POSTPARTUM VISIT Patient name: Betty Acosta MRN [id number]  Date of birth: 01-03-1986 Chief Complaint:   Postpartum Care  History of Present Illness:   Betty Acosta is a 37 y.o. G21P1001  female being seen today for a postpartum visit. She is 8 weeks postpartum following a spontaneous vaginal delivery at 33.1 gestational weeks. IOL: no. Anesthesia: epidural.  Laceration: none.  Complications: PPROM @ [redacted]w[redacted]d with resulting SOL; arrived at [facility] however the NICU was closed and she was tx to [facility] for management. Inpatient contraception: no.   Pregnancy complicated by dx of GDM about a month prior to delivery . Tobacco use: no. Substance use disorder: no. Last pap smear: July 2019 and results were NILM w/ HRHPV negative. Next pap smear due: July 2024 No LMP recorded.  Postpartum course has been uncomplicated. Bleeding none. Bowel function is normal. Bladder function is normal. Urinary incontinence? no, fecal incontinence? no Patient is not sexually active. Last sexual activity: prior to birth of baby. Desired contraception: condoms . Patient does not know about a pregnancy in the future.  Desired family size is unsure number of children.   Upstream - 12/10/22 1507       Pregnancy Intention Screening   Does the patient want to become pregnant in the next year? No    Would the patient like to discuss contraceptive options today? No      Contraception Wrap Up   Current Method Female Condom    End Method Female Condom            The pregnancy intention screening data noted above was reviewed. Potential methods of contraception were discussed. The patient elected to proceed with Female Condom.  Edinburgh Postpartum Depression Screening: negative  Edinburgh Postnatal Depression Scale - 12/10/22 1508       Edinburgh Postnatal Depression Scale:  In the Past 7 Days   I have been able to laugh and see the funny side of things. 0    I have looked forward with enjoyment to things. 0    I have blamed  myself unnecessarily when things went wrong. 1    I have been anxious or worried for no good reason. 0    I have felt scared or panicky for no good reason. 0    Things have been getting on top of me. 1    I have been so unhappy that I have had difficulty sleeping. 0    I have felt sad or miserable. 0    I have been so unhappy that I have been crying. 0    The thought of harming myself has occurred to me. 0    Edinburgh Postnatal Depression Scale Total 2                05/26/2022    9:59 AM 07/20/2019   11:47 AM  GAD 7 : Generalized Anxiety Score  Nervous, Anxious, on Edge 1 0  Control/stop worrying 0 0  Worry too much - different things 1 0  Trouble relaxing 0 0  Restless 0 0  Easily annoyed or irritable 0 0  Afraid - awful might happen 0 1  Total GAD 7 Score 2 1  Anxiety Difficulty  Not difficult at all     Baby's course has been complicated by NICU stay of approx 4 weeks for prematurity; doing well now . Baby is feeding by breast and bottle: milk supply adequate. Infant has a pediatrician/family doctor? Yes.  Childcare strategy if returning to work/school:  n/a-stay at home mom.  Pt has material needs met for her and baby: Yes.   Review of Systems:   Pertinent items are noted in HPI Denies Abnormal vaginal discharge w/ itching/odor/irritation, headaches, visual changes, shortness of breath, chest pain, abdominal pain, severe nausea/vomiting, or problems with urination or bowel movements. Pertinent History Reviewed:  Reviewed past medical,surgical, obstetrical and family history.  Reviewed problem list, medications and allergies. OB History  Gravida Para Term Preterm AB Living  2 1 1     1   SAB IAB Ectopic Multiple Live Births          1    # Outcome Date GA Lbr Len/2nd Weight Sex Type Anes PTL Lv  2 Gravida           1 Term 07/29/17 [redacted]w[redacted]d   M Vag-Spont EPI N LIV     Complications: Intraamniotic Infection   Physical Assessment:   Vitals:   12/10/22 1504  BP: 109/65   Pulse: (!) 56  Weight: 137 lb (62.1 kg)  Height: 5\' 2"  (1.575 m)  Body mass index is 25.06 kg/m.       Physical Examination:   General appearance: alert, well appearing, and in no distress  Mental status: alert, oriented to person, place, and time  Skin: warm & dry   Cardiovascular: normal heart rate noted   Respiratory: normal respiratory effort, no distress   Breasts: deferred, no complaints   Abdomen: soft, non-tender   Pelvic: examination not indicated. Thin prep pap obtained: No  Rectal: not examined  Extremities: Edema: none        No results found for this or any previous visit (from the past 24 hour(s)).  Assessment & Plan:  1) Postpartum exam 2) Eight wks s/p  preterm vag delivery after PPROM 3) breast & bottle feeding 4) Depression screening 5) Contraception counseling 6) Pap due today, declines; wants to schedule an annual exam w Pap 7) GDM, for 2h GTT 8) Possible rectocele, noted during pregnancy but seems to have resolved now; will examine at annual  Essential components of care per ACOG recommendations:  1.  Mood and well being:  If positive depression screen, discussed and plan developed.  If using tobacco we discussed reduction/cessation and risk of relapse If current substance abuse, we discussed and referral to local resources was offered.   2. Infant care and feeding:  If breastfeeding, discussed returning to work, pumping, breastfeeding-associated pain, guidance regarding return to fertility while lactating if not using another method. If needed, patient was provided with a letter to be allowed to pump q 2-3hrs to support lactation in a private location with access to a refrigerator to store breastmilk.   Recommended that all caregivers be immunized for flu, pertussis and other preventable communicable diseases If pt does not have material needs met for her/baby, referred to local resources for help obtaining these.  3. Sexuality, contraception and birth  spacing Provided guidance regarding sexuality, management of dyspareunia, and resumption of intercourse Discussed avoiding interpregnancy interval <20mths and recommended birth spacing of 18 months  4. Sleep and fatigue Discussed coping options for fatigue and sleep disruption Encouraged family/partner/community support of 4 hrs of uninterrupted sleep to help with mood and fatigue  5. Physical recovery  If pt had a C/S, assessed incisional pain and providing guidance on normal vs prolonged recovery If pt had a laceration, perineal healing and pain reviewed.  If urinary or fecal incontinence, discussed management and referred to PT or uro/gyn if indicated  Patient is safe to resume physical activity. Discussed attainment of healthy weight.  6.  Chronic disease management Discussed pregnancy complications if any, and their implications for future childbearing and long-term maternal health. Aspirin to reduce risk of preeclampsia not applicable. Pt had GDM: yes. If yes, 2hr GTT scheduled: asked for it to be scheduled ASAP. Reviewed medications and non-pregnant dosing including consideration of whether pt is breastfeeding using a reliable resource such as LactMed: yes Referred for f/u w/ PCP or subspecialist providers as indicated: not applicable  7. Health maintenance Mammogram at 35yo or earlier if indicated Pap smears as indicated  Meds: No orders of the defined types were placed in this encounter.   Follow-up: Return for 2hr GTT and annual exam/Pap at patient convenience.   No orders of the defined types were placed in this encounter.   Arabella Merles CNM 12/10/2022 3:41 PM

## 2022-12-10 NOTE — Patient Instructions (Signed)
Use the Lact Med site to help determine safety of medications while breastfeeding; also use the website www.postpartum.net for helpful postpartum resources!

## 2022-12-18 ENCOUNTER — Encounter: Payer: Self-pay | Admitting: Women's Health

## 2023-01-12 ENCOUNTER — Ambulatory Visit: Payer: Managed Care, Other (non HMO) | Admitting: Women's Health

## 2023-03-03 ENCOUNTER — Emergency Department (HOSPITAL_COMMUNITY)
Admission: EM | Admit: 2023-03-03 | Discharge: 2023-03-04 | Disposition: A | Discharge status: Home / Self Care | Payer: Managed Care, Other (non HMO) | Attending: Emergency Medicine | Admitting: Emergency Medicine

## 2023-03-03 ENCOUNTER — Other Ambulatory Visit: Payer: Self-pay

## 2023-03-03 ENCOUNTER — Encounter (HOSPITAL_COMMUNITY): Payer: Self-pay | Admitting: Emergency Medicine

## 2023-03-03 DIAGNOSIS — N3001 Acute cystitis with hematuria: Secondary | ICD-10-CM | POA: Insufficient documentation

## 2023-03-03 DIAGNOSIS — E119 Type 2 diabetes mellitus without complications: Secondary | ICD-10-CM | POA: Diagnosis not present

## 2023-03-03 DIAGNOSIS — R300 Dysuria: Secondary | ICD-10-CM | POA: Diagnosis present

## 2023-03-03 LAB — PREGNANCY, URINE: Preg Test, Ur: NEGATIVE

## 2023-03-03 NOTE — ED Triage Notes (Signed)
Pt in with UTI symptoms on and off since having her baby 4 mo's ago. Not currently on abx, states she has been taking AZO but getting little relief. Now has low abdomen pain and hematuria.

## 2023-03-04 LAB — URINALYSIS, ROUTINE W REFLEX MICROSCOPIC
Bacteria, UA: NONE SEEN
Bilirubin Urine: NEGATIVE
Glucose, UA: NEGATIVE mg/dL
Ketones, ur: NEGATIVE mg/dL
Nitrite: NEGATIVE
Protein, ur: 100 mg/dL — AB
RBC / HPF: >50 RBC/hpf (ref 0–5)
Specific Gravity, Urine: 1.012 (ref 1.005–1.030)
Trans Epithel, UA: 2
WBC, UA: >50 WBC/hpf (ref 0–5)
pH: 6 (ref 5.0–8.0)

## 2023-03-04 MED ORDER — PHENAZOPYRIDINE HCL 100 MG PO TABS
100.0000 mg | ORAL_TABLET | Freq: Once | ORAL | Status: AC
Start: 2023-03-04 — End: 2023-03-04
  Administered 2023-03-04: 100 mg via ORAL
  Filled 2023-03-04: qty 1

## 2023-03-04 MED ORDER — CEPHALEXIN 500 MG PO CAPS
500.0000 mg | ORAL_CAPSULE | Freq: Once | ORAL | Status: AC
  Administered 2023-03-04: 500 mg via ORAL
  Filled 2023-03-04: qty 1

## 2023-03-04 MED ORDER — CEPHALEXIN 500 MG PO CAPS: 500.0000 mg | ORAL_CAPSULE | Freq: Three times a day (TID) | ORAL | 0 refills | Status: AC

## 2023-03-04 MED ORDER — KETOROLAC TROMETHAMINE 30 MG/ML IJ SOLN
30.0000 mg | Freq: Once | INTRAMUSCULAR | Status: AC
  Administered 2023-03-04: 30 mg via INTRAMUSCULAR
  Filled 2023-03-04: qty 1

## 2023-03-04 NOTE — Discharge Instructions (Signed)
You were seen today for urinary symptoms.  Your urinalysis is consistent with likely a UTI.  Take antibiotics as prescribed.  If you develop flank pain or fever, you should be reevaluated.

## 2023-03-04 NOTE — ED Provider Notes (Signed)
[place] EMERGENCY DEPARTMENT AT [facility] Provider Note   CSN: [id number] Arrival date & time: 03/03/23  2250     History  Chief Complaint  Patient presents with   Urinary Tract Infection    Sharmon Hunnewell is a 37 y.o. female.  HPI     This is a 37 year old female who presents with dysuria and hematuria.  Patient reports that she is postpartum 4 months.  Since giving birth to her child, she states she has had dysuria on and off.  She has never been treated with antibiotics.  However tonight her dysuria got worse and she developed hematuria.  She has not had any fevers or flank pain.  Reports that she is married and has only had sex 1 time after having her baby.  She is not concerned for STDs and is not having any vaginal discharge.  Does report some suprapubic discomfort.  Home Medications Prior to Admission medications   Medication Sig Start Date End Date Taking? Authorizing Provider  cephALEXin (KEFLEX) 500 MG capsule Take 1 capsule (500 mg total) by mouth 3 (three) times daily. 03/04/23  Yes Dalal Livengood, Mayer Masker, MD  Prenatal Vit-Fe Fumarate-FA (MULTIVITAMIN-PRENATAL) 27-0.8 MG TABS tablet Take 1 tablet by mouth daily at 12 noon.    [provider]      Allergies    Patient has no known allergies.    Review of Systems   Review of Systems  Constitutional:  Negative for fever.  Genitourinary:  Positive for dysuria and hematuria. Negative for flank pain, vaginal bleeding, vaginal discharge and vaginal pain.  All other systems reviewed and are negative.   Physical Exam Updated Vital Signs BP 120/86   Pulse 92   Temp 98.7 F (37.1 C) (Oral)   Resp 15   Wt 62.1 kg   LMP 02/23/2023 (Exact Date)   SpO2 99%   Breastfeeding No   BMI 25.04 kg/m  Physical Exam Vitals and nursing note reviewed.  Constitutional:      Appearance: She is well-developed. She is not ill-appearing.  HENT:     Head: Normocephalic and atraumatic.  Eyes:     Pupils: Pupils  are equal, round, and reactive to light.  Cardiovascular:     Rate and Rhythm: Normal rate and regular rhythm.  Pulmonary:     Effort: Pulmonary effort is normal. No respiratory distress.  Abdominal:     Palpations: Abdomen is soft.     Tenderness: There is abdominal tenderness. There is no right CVA tenderness or left CVA tenderness.     Comments: Mild suprapubic tenderness to palpation, no rebound or guarding  Musculoskeletal:     Cervical back: Neck supple.  Skin:    General: Skin is warm and dry.  Neurological:     Mental Status: She is alert and oriented to person, place, and time.  Psychiatric:        Mood and Affect: Mood normal.     ED Results / Procedures / Treatments   Labs (all labs ordered are listed, but only abnormal results are displayed) Labs Reviewed  URINALYSIS, ROUTINE W REFLEX MICROSCOPIC - Abnormal; Notable for the following components:      Result Value   Color, Urine RED (*)    APPearance CLOUDY (*)    Hgb urine dipstick MODERATE (*)    Protein, ur 100 (*)    Leukocytes,Ua MODERATE (*)    All other components within normal limits  URINE CULTURE  PREGNANCY, URINE  EKG None  Radiology No results found.  Procedures Procedures    Medications Ordered in ED Medications  cephALEXin (KEFLEX) capsule 500 mg (has no administration in time range)  phenazopyridine (PYRIDIUM) tablet 100 mg (has no administration in time range)  ketorolac (TORADOL) 30 MG/ML injection 30 mg (has no administration in time range)    ED Course/ Medical Decision Making/ A&P                                 Medical Decision Making Amount and/or Complexity of Data Reviewed Labs: ordered.  Risk Prescription drug management.   This patient presents to the ED for concern of dysuria, hematuria, this involves an extensive number of treatment options, and is a complaint that carries with it a high risk of complications and morbidity.  I considered the following  differential and admission for this acute, potentially life threatening condition.  The differential diagnosis includes acute cystitis, pyelonephritis, less likely pregnancy related, STD  MDM:    This is a 37 year old female who presents with dysuria and hematuria.  She feels like she may have a UTI.  She is nontoxic and vital signs are reassuring.  She is afebrile.  No signs or symptoms of pyelonephritis.  She does not have any concerns for STDs and only reports 1 sexual partner and 1 intercourse since birth.  Urinalysis does have moderate leuk esterase, greater than 50 white cells and greater than 50 red cells.  Will culture and treat given her classic symptoms.  Encouraged her to follow-up with her OB/GYN for her normal postpartum visit if she has not already done so.  She was given strict return precautions.  Pelvic exam at this time deferred given classic UTI symptoms.  (Labs, imaging, consults)  Labs: I Ordered, and personally interpreted labs.  The pertinent results include: Urinalysis, urine pregnancy  Imaging Studies ordered: I ordered imaging studies including none I independently visualized and interpreted imaging. I agree with the radiologist interpretation  Additional history obtained from chart review.  External records from outside source obtained and reviewed including prior evaluations  Cardiac Monitoring: The patient was not maintained on a cardiac monitor.  If on the cardiac monitor, I personally viewed and interpreted the cardiac monitored which showed an underlying rhythm of: N/A  Reevaluation: After the interventions noted above, I reevaluated the patient and found that they have :stayed the same  Social Determinants of Health:  lives independently  Disposition: Discharge  Co morbidities that complicate the patient evaluation  Past Medical History:  Diagnosis Date   Anxiety    Asthma    Gestational diabetes    IBS (irritable bowel syndrome)    Vaginal Pap  smear, abnormal      Medicines Meds ordered this encounter  Medications   cephALEXin (KEFLEX) capsule 500 mg   phenazopyridine (PYRIDIUM) tablet 100 mg   ketorolac (TORADOL) 30 MG/ML injection 30 mg   cephALEXin (KEFLEX) 500 MG capsule    Sig: Take 1 capsule (500 mg total) by mouth 3 (three) times daily.    Dispense:  21 capsule    Refill:  0    I have reviewed the patients home medicines and have made adjustments as needed  Problem List / ED Course: Problem List Items Addressed This Visit   None Visit Diagnoses     Acute cystitis with hematuria    -  Primary  Final Clinical Impression(s) / ED Diagnoses Final diagnoses:  Acute cystitis with hematuria    Rx / DC Orders ED Discharge Orders          Ordered    cephALEXin (KEFLEX) 500 MG capsule  3 times daily        03/04/23 0105              Dua Mehler, Mayer Masker, MD 03/04/23 [phone]

## 2023-03-06 LAB — URINE CULTURE: Culture: >=100000 — AB

## 2023-03-07 ENCOUNTER — Telehealth (HOSPITAL_BASED_OUTPATIENT_CLINIC_OR_DEPARTMENT_OTHER): Payer: Self-pay | Admitting: *Deleted

## 2023-03-07 NOTE — Telephone Encounter (Signed)
Post ED Visit - Positive Culture Follow-up  Culture report reviewed by antimicrobial stewardship pharmacist: [facility] []  Enzo Bi, Pharm.D. []  Celedonio Miyamoto, Pharm.D., BCPS AQ-ID []  Garvin Fila, Pharm.D., BCPS []  Georgina Pillion, Pharm.D., BCPS []  [place], [address].D., BCPS, AAHIVP []  Estella Husk, Pharm.D., BCPS, AAHIVP []  Lysle Pearl, PharmD, BCPS []  Phillips Climes, PharmD, BCPS []  Agapito Games, PharmD, BCPS []  Verlan Friends, PharmD []  Mervyn Gay, PharmD, BCPS [x]  Ivery Quale, PharmD  [facility] []  Len Childs, PharmD []  Greer Pickerel, PharmD []  Adalberto Cole, PharmD []  Perlie Gold, Rph []  Lonell Face) Jean Rosenthal, PharmD []  Earl Many, PharmD []  Junita Push, PharmD []  Dorna Leitz, PharmD []  Terrilee Files, PharmD []  Lynann Beaver, PharmD []  Keturah Barre, PharmD []  Loralee Pacas, PharmD []  Bernadene Person, PharmD   Positive urine culture Treated with Cephalexin, organism sensitive to the same and no further patient follow-up is required at this time.  Betty Acosta 03/07/2023, 8:16 AM

## 2023-06-02 ENCOUNTER — Ambulatory Visit: Admitting: Women's Health

## 2023-06-02 ENCOUNTER — Encounter: Payer: Self-pay | Admitting: Women's Health

## 2023-06-02 ENCOUNTER — Other Ambulatory Visit (HOSPITAL_COMMUNITY)
Admission: RE | Admit: 2023-06-02 | Discharge: 2023-06-02 | Disposition: A | Discharge status: Home / Self Care | Source: Ambulatory Visit | Attending: Women's Health | Admitting: Women's Health

## 2023-06-02 VITALS — BP 115/72 | HR 84 | Ht 62.0 in | Wt 123.0 lb

## 2023-06-02 DIAGNOSIS — N6310 Unspecified lump in the right breast, unspecified quadrant: Secondary | ICD-10-CM | POA: Diagnosis not present

## 2023-06-02 DIAGNOSIS — Z124 Encounter for screening for malignant neoplasm of cervix: Secondary | ICD-10-CM

## 2023-06-02 NOTE — Progress Notes (Signed)
 GYN VISIT Patient name: Betty Acosta MRN [id number]  Date of birth: 1985/04/28 Chief Complaint:   Right breast lump  History of Present Illness:   Betty Acosta is a 38 y.o. G30P1102 female being seen today for report of Rt breast lump. Had HER scan ~2mth ago and was found then, pt didn't know she had it, reports says Rt breast 6 o'clock retroareolar. 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      Patient's last menstrual period was 05/14/2023. Last pap 2019. Results were: NILM w/ HRHPV negative     05/26/2022    9:58 AM 07/20/2019   11:44 AM 09/21/2017   11:25 AM 01/07/2017    1:53 PM 12/11/2016    1:50 PM  Depression screen PHQ 2/9  Decreased Interest 0 0 0 0 0  Down, Depressed, Hopeless 0 0 0 0 0  PHQ - 2 Score 0 0 0 0 0  Altered sleeping 1 0  1   Tired, decreased energy 2 1  2    Change in appetite 2 0  0   Feeling bad or failure about yourself  0 0  0   Trouble concentrating 1 0  0   Moving slowly or fidgety/restless 0 0  0   Suicidal thoughts 0 0  0   PHQ-9 Score 6 1  3    Difficult doing work/chores Not difficult at all Not difficult at all  Not difficult at all         05/26/2022    9:59 AM 07/20/2019   11:47 AM  GAD 7 : Generalized Anxiety Score  Nervous, Anxious, on Edge 1 0  Control/stop worrying 0 0  Worry too much - different things 1 0  Trouble relaxing 0 0  Restless 0 0  Easily annoyed or irritable 0 0  Afraid - awful might happen 0 1  Total GAD 7 Score 2 1  Anxiety Difficulty  Not difficult at all     Review of Systems:   Pertinent items are noted in HPI Denies fever/chills, dizziness, headaches, visual disturbances, fatigue, shortness of breath, chest pain, abdominal pain, vomiting, abnormal vaginal discharge/itching/odor/irritation, problems with periods, bowel movements, urination, or intercourse unless otherwise stated above.  Pertinent History Reviewed:  Reviewed past medical,surgical, social, obstetrical and family history.  Reviewed  problem list, medications and allergies. Physical Assessment:   Vitals:   06/02/23 1607  BP: 115/72  Pulse: 84  Weight: 123 lb (55.8 kg)  Height: 5\' 2"  (1.575 m)  Body mass index is 22.5 kg/m.       Physical Examination:   General appearance: alert, well appearing, and in no distress  Mental status: alert, oriented to person, place, and time  Skin: warm & dry   Cardiovascular: normal heart rate noted  Respiratory: normal respiratory effort, no distress  Breasts - breasts appear normal, no suspicious masses, no skin or nipple changes or axillary nodes   Abdomen: soft, non-tender   Pelvic: examination not indicated  Extremities: no edema   Chaperone: Peggy Dones  No results found for this or any previous visit (from the past 24 hours).  Assessment & Plan:  1) Rt breast mass> seen on HER scan ~17mth ago, 6 o'clock retroareolar, not felt on exam. Will get diagnostic mammo/us, pt wants at [facility], note routed to [facility] to schedule  2) Cervical cancer screen> pap today  Meds: No orders of the defined types were placed in this encounter.   Orders Placed This Encounter  Procedures   MM 3D DIAGNOSTIC MAMMOGRAM BILATERAL BREAST   US LIMITED ULTRASOUND INCLUDING AXILLA RIGHT BREAST    Return in about 1 year (around 06/01/2024) for Physical.  Cheral Marker CNM, [facility] 06/02/2023 4:42 PM

## 2023-06-10 ENCOUNTER — Encounter: Payer: Self-pay | Admitting: Women's Health

## 2023-06-10 LAB — CYTOLOGY - PAP
Comment: NEGATIVE
Diagnosis: NEGATIVE
High risk HPV: NEGATIVE

## 2023-11-18 ENCOUNTER — Encounter (HOSPITAL_COMMUNITY): Payer: Self-pay | Admitting: Obstetrics and Gynecology

## 2023-11-19 ENCOUNTER — Other Ambulatory Visit (HOSPITAL_COMMUNITY): Payer: Self-pay | Admitting: Obstetrics and Gynecology

## 2023-11-19 DIAGNOSIS — N63 Unspecified lump in unspecified breast: Secondary | ICD-10-CM

## 2023-12-21 ENCOUNTER — Encounter (HOSPITAL_COMMUNITY): Payer: Self-pay | Admitting: Obstetrics and Gynecology

## 2023-12-21 ENCOUNTER — Encounter: Payer: Self-pay | Admitting: Women's Health

## 2023-12-21 DIAGNOSIS — O43109 Malformation of placenta, unspecified, unspecified trimester: Secondary | ICD-10-CM | POA: Insufficient documentation
